# Patient Record
Sex: Female | Born: 1970
Health system: Southern US, Community
[De-identification: ages and names within clinical notes are randomized; demographics above are authoritative.]

## PROBLEM LIST (undated history)

## (undated) DIAGNOSIS — K7460 Unspecified cirrhosis of liver: Secondary | ICD-10-CM

## (undated) DIAGNOSIS — G43909 Migraine, unspecified, not intractable, without status migrainosus: Secondary | ICD-10-CM

## (undated) DIAGNOSIS — F419 Anxiety disorder, unspecified: Secondary | ICD-10-CM

## (undated) DIAGNOSIS — F909 Attention-deficit hyperactivity disorder, unspecified type: Secondary | ICD-10-CM

## (undated) DIAGNOSIS — G4730 Sleep apnea, unspecified: Secondary | ICD-10-CM

## (undated) DIAGNOSIS — I639 Cerebral infarction, unspecified: Secondary | ICD-10-CM

## (undated) DIAGNOSIS — M1990 Unspecified osteoarthritis, unspecified site: Secondary | ICD-10-CM

## (undated) DIAGNOSIS — E119 Type 2 diabetes mellitus without complications: Secondary | ICD-10-CM

## (undated) DIAGNOSIS — K760 Fatty (change of) liver, not elsewhere classified: Secondary | ICD-10-CM

## (undated) DIAGNOSIS — J189 Pneumonia, unspecified organism: Secondary | ICD-10-CM

## (undated) DIAGNOSIS — R569 Unspecified convulsions: Secondary | ICD-10-CM

## (undated) DIAGNOSIS — R079 Chest pain, unspecified: Secondary | ICD-10-CM

## (undated) DIAGNOSIS — R160 Hepatomegaly, not elsewhere classified: Secondary | ICD-10-CM

## (undated) DIAGNOSIS — F32A Depression, unspecified: Secondary | ICD-10-CM

## (undated) HISTORY — DX: Migraine, unspecified, not intractable, without status migrainosus: G43.909

## (undated) HISTORY — PX: ANTERIOR CERVICAL DECOMP/DISCECTOMY FUSION: SHX1161

## (undated) HISTORY — DX: Chest pain, unspecified: R07.9

## (undated) HISTORY — PX: LAPAROSCOPIC PELVIC LYMPH NODE BIOPSY: SHX5914

## (undated) HISTORY — DX: Unspecified convulsions: R56.9

## (undated) HISTORY — PX: WISDOM TOOTH EXTRACTION: SHX21

## (undated) HISTORY — DX: Anxiety disorder, unspecified: F41.9

## (undated) HISTORY — PX: VAGINAL HYSTERECTOMY: SUR661

---

## 1998-07-02 ENCOUNTER — Other Ambulatory Visit: Admission: RE | Admit: 1998-07-02 | Discharge: 1998-07-02 | Payer: Self-pay | Admitting: *Deleted

## 1998-10-16 ENCOUNTER — Other Ambulatory Visit: Admission: RE | Admit: 1998-10-16 | Discharge: 1998-10-16 | Payer: Self-pay | Admitting: Obstetrics and Gynecology

## 1999-03-09 ENCOUNTER — Inpatient Hospital Stay (HOSPITAL_COMMUNITY): Admission: AD | Admit: 1999-03-09 | Discharge: 1999-03-09 | Payer: Self-pay | Admitting: Obstetrics and Gynecology

## 1999-03-09 ENCOUNTER — Encounter: Admission: RE | Admit: 1999-03-09 | Discharge: 1999-06-07 | Payer: Self-pay | Admitting: Obstetrics and Gynecology

## 1999-05-26 ENCOUNTER — Inpatient Hospital Stay (HOSPITAL_COMMUNITY): Admission: AD | Admit: 1999-05-26 | Discharge: 1999-05-29 | Payer: Self-pay | Admitting: Obstetrics and Gynecology

## 2000-03-20 ENCOUNTER — Other Ambulatory Visit: Admission: RE | Admit: 2000-03-20 | Discharge: 2000-03-20 | Payer: Self-pay | Admitting: Obstetrics and Gynecology

## 2000-10-04 ENCOUNTER — Encounter (INDEPENDENT_AMBULATORY_CARE_PROVIDER_SITE_OTHER): Payer: Self-pay

## 2000-10-04 ENCOUNTER — Inpatient Hospital Stay (HOSPITAL_COMMUNITY): Admission: RE | Admit: 2000-10-04 | Discharge: 2000-10-07 | Payer: Self-pay | Admitting: Obstetrics and Gynecology

## 2002-04-02 ENCOUNTER — Encounter: Admission: RE | Admit: 2002-04-02 | Discharge: 2002-04-02 | Payer: Self-pay | Admitting: Obstetrics and Gynecology

## 2002-04-02 ENCOUNTER — Encounter: Payer: Self-pay | Admitting: Obstetrics and Gynecology

## 2002-05-27 ENCOUNTER — Encounter: Admission: RE | Admit: 2002-05-27 | Discharge: 2002-05-27 | Payer: Self-pay

## 2002-09-24 ENCOUNTER — Encounter: Admission: RE | Admit: 2002-09-24 | Discharge: 2002-09-24 | Payer: Self-pay | Admitting: Obstetrics and Gynecology

## 2002-09-24 ENCOUNTER — Encounter: Payer: Self-pay | Admitting: Obstetrics and Gynecology

## 2003-01-21 ENCOUNTER — Ambulatory Visit (HOSPITAL_COMMUNITY): Admission: RE | Admit: 2003-01-21 | Discharge: 2003-01-21 | Payer: Self-pay | Admitting: Neurology

## 2003-04-02 ENCOUNTER — Encounter: Admission: RE | Admit: 2003-04-02 | Discharge: 2003-04-02 | Payer: Self-pay | Admitting: Obstetrics and Gynecology

## 2003-04-02 ENCOUNTER — Encounter: Payer: Self-pay | Admitting: Obstetrics and Gynecology

## 2003-05-07 ENCOUNTER — Other Ambulatory Visit: Admission: RE | Admit: 2003-05-07 | Discharge: 2003-05-07 | Payer: Self-pay | Admitting: Obstetrics and Gynecology

## 2003-06-19 ENCOUNTER — Ambulatory Visit (HOSPITAL_COMMUNITY): Admission: RE | Admit: 2003-06-19 | Discharge: 2003-06-19 | Payer: Self-pay | Admitting: Obstetrics and Gynecology

## 2004-05-19 ENCOUNTER — Other Ambulatory Visit: Admission: RE | Admit: 2004-05-19 | Discharge: 2004-05-19 | Payer: Self-pay | Admitting: Obstetrics and Gynecology

## 2005-06-29 ENCOUNTER — Other Ambulatory Visit: Admission: RE | Admit: 2005-06-29 | Discharge: 2005-06-29 | Payer: Self-pay | Admitting: Obstetrics and Gynecology

## 2006-02-13 ENCOUNTER — Ambulatory Visit (HOSPITAL_COMMUNITY): Admission: RE | Admit: 2006-02-13 | Discharge: 2006-02-13 | Payer: Self-pay | Admitting: Obstetrics and Gynecology

## 2006-04-21 ENCOUNTER — Encounter (INDEPENDENT_AMBULATORY_CARE_PROVIDER_SITE_OTHER): Payer: Self-pay | Admitting: Specialist

## 2006-04-21 ENCOUNTER — Ambulatory Visit (HOSPITAL_COMMUNITY): Admission: RE | Admit: 2006-04-21 | Discharge: 2006-04-21 | Payer: Self-pay | Admitting: General Surgery

## 2009-10-25 ENCOUNTER — Emergency Department (HOSPITAL_COMMUNITY): Admission: EM | Admit: 2009-10-25 | Discharge: 2009-10-25 | Payer: Self-pay | Admitting: Emergency Medicine

## 2010-01-06 ENCOUNTER — Emergency Department (HOSPITAL_COMMUNITY): Admission: EM | Admit: 2010-01-06 | Discharge: 2010-01-06 | Payer: Self-pay | Admitting: Emergency Medicine

## 2010-03-05 ENCOUNTER — Encounter: Admission: RE | Admit: 2010-03-05 | Discharge: 2010-03-05 | Payer: Self-pay | Admitting: Gastroenterology

## 2010-06-22 ENCOUNTER — Ambulatory Visit (HOSPITAL_COMMUNITY): Admission: RE | Admit: 2010-06-22 | Discharge: 2010-06-22 | Payer: Self-pay | Admitting: Obstetrics and Gynecology

## 2010-11-07 LAB — COMPREHENSIVE METABOLIC PANEL
AST: 50 U/L — ABNORMAL HIGH (ref 0–37)
Albumin: 4.1 g/dL (ref 3.5–5.2)
Calcium: 9.1 mg/dL (ref 8.4–10.5)
Chloride: 106 mEq/L (ref 96–112)
GFR calc Af Amer: >60 mL/min (ref >60)
GFR calc non Af Amer: >60 mL/min (ref >60)
Glucose, Bld: 123 mg/dL — ABNORMAL HIGH (ref 70–99)
Total Bilirubin: 0.3 mg/dL (ref 0.3–1.2)
Total Protein: 7.1 g/dL (ref 6.0–8.3)

## 2010-11-07 LAB — DIFFERENTIAL
Basophils Absolute: 0 10*3/uL (ref 0.0–0.1)
Basophils Relative: 0 % (ref 0–1)
Eosinophils Absolute: 0.4 10*3/uL (ref 0.0–0.7)
Eosinophils Relative: 4 % (ref 0–5)
Lymphs Abs: 1.7 10*3/uL (ref 0.7–4.0)
Monocytes Absolute: 0.6 10*3/uL (ref 0.1–1.0)
Monocytes Relative: 7 % (ref 3–12)

## 2010-11-07 LAB — CBC
MCHC: 33.2 g/dL (ref 30.0–36.0)
Platelets: 257 10*3/uL (ref 150–400)
RDW: 12.3 % (ref 11.5–15.5)
WBC: 9.5 10*3/uL (ref 4.0–10.5)

## 2010-11-07 LAB — RAPID URINE DRUG SCREEN, HOSP PERFORMED
Amphetamines: NOT DETECTED
Barbiturates: NOT DETECTED
Benzodiazepines: NOT DETECTED
Cocaine: NOT DETECTED

## 2010-11-07 LAB — URINALYSIS, ROUTINE W REFLEX MICROSCOPIC
Hgb urine dipstick: NEGATIVE
pH: 5.5 (ref 5.0–8.0)

## 2010-12-31 NOTE — Op Note (Signed)
   NAME:  Mariah Gregory, Mariah Gregory                      ACCOUNT NO.:  [id number]   MEDICAL RECORD NO.:  [id number]                   PATIENT TYPE:  OUT   LOCATION:  [facility]                                  FACILITY:  [facility]   PHYSICIAN:  Marlan Palau, M.D.               DATE OF BIRTH:  1971-08-03   DATE OF PROCEDURE:  01/21/2003  DATE OF DISCHARGE:                                 OPERATIVE REPORT   HISTORY:  This is a 40 year old patient with a history of headaches and  paresthesias in the upper extremities.  The patient is being evaluated for  possible demyelinating disease.   A lumbar puncture was performed in the fetal position on the left side.  Approximately 2 mL of 1% Xylocaine was used as a local anesthetic.  A 20-  gauge spinal needle was inserted into the L3-4 interspace and approximately  18 mL of clear, colorless spinal fluid was removed for testing.  Opening  pressure was 140 mmH2O.  Tube #1 was sent for VDRL and cryptococcal antigen.  Tube #2 was sent for oligoclonal banding, IgG/albumin ratio.  Tube #3 was  sent for cells, differential, glucose, protein.  Tube #4 was sent for Lyme  panel.  Blood work was sent for ANA, rheumatoid factor, sedimentation rate,  antiphospholipid antibody panel, lupus anticoagulant, and homocysteine  level.  There were no complications of the above procedure.  The patient  tolerated the procedure well.                                               Marlan Palau, M.D.    CKW/MEDQ  D:  01/21/2003  T:  01/21/2003  Job:  [id number]

## 2010-12-31 NOTE — Op Note (Signed)
NAME:  Mariah Gregory, Mariah Gregory                      ACCOUNT NO.:  [id number]   MEDICAL RECORD NO.:  [id number]                   PATIENT TYPE:  AMB   LOCATION:  [facility]                                  FACILITY:  [facility]   PHYSICIAN:  Michelle L. Vincente Poli, M.D.            DATE OF BIRTH:  [birth year]   DATE OF PROCEDURE:  06/19/2003  DATE OF DISCHARGE:                                 OPERATIVE REPORT   PREOPERATIVE DIAGNOSIS:  Pelvic __________   PROCEDURE:  Diagnostic laparoscopy.   SURGEON:  Michelle L. Vincente Poli, M.D.   ANESTHESIA:  General.   FINDINGS:  __________.   DESCRIPTION OF PROCEDURE:  She was intubated without difficulty and was  placed in the lithotomy position.  The abdomen and genitalia were prepped  and draped in the usual sterile fashion and a catheter was placed into the  bladder.  A sponge stick was inserted into the vagina.  Attention was turned  to the abdomen, where a small infraumbilical incision was made with a  scalpel.  Then the Veress needle was inserted and pneumoperitoneum was  achieved without difficulty.  The 10/11 mm trocar was inserted without  difficulty.  We then inserted the laparoscope into the abdominal cavity and  immediately noted that there were some infraumbilical adhesions surrounding  the infraumbilical port.  There were no bleeders noted and no bowel injuries  noted.  We then visualized the pelvis.  The patient was placed in  Trendelenburg position and a 5 mm suprapubic port was then placed under  direct visualization.  Inspection of the pelvis reveals that the right ovary  was, in fact, retroperitoneal, adherent to the right sidewall.  There was no  cyst seen, and it grossly appeared very normal.  At cuff, however, there  were some thick and filmy adhesions of the bowel to the vaginal cuff, all  along the cuff, and especially on the right sidewall.  This is consistent  with where the patient's pain was as well as her complaint of dyspareunia.  We  then used sharp dissection and carefully took the adhesions down with  good hemostasis.  There was some serous fluid in the cul-de-sac and this was  aspirated, and I think this is why there was a possible ovarian cyst  detected on ultrasound in the office because of the serous fluid and the  pelvic adhesions noted.  At the end of the procedure no vaginal bleeding was  noted.  The patient was then placed in reverse Trendelenburg, and there was  no bleeding that was noted in the cul-de-sac.  We then removed the 5 mm  trocar without any difficulty and it was hemostatic, and then after  pneumoperitoneum was released, the 10/11 mm trocar was released.  The  incisions were closed with 3-0 Vicryl interrupted and local was infiltrated  at each area.  All sponge, lap, and instrument counts were correct x2.  The  patient went to  the recovery room in stable condition.                                               Michelle L. Vincente Poli, M.D.    Florestine Avers  D:  06/19/2003  T:  06/19/2003  Job:  [id number]

## 2010-12-31 NOTE — Op Note (Signed)
Mariah Gregory, Mariah Gregory            ACCOUNT NO.:  [id number]   MEDICAL RECORD NO.:  [id number]          PATIENT TYPE:  AMB   LOCATION:  DAY                          FACILITY:  [facility]   PHYSICIAN:  Ollen Gross. Vernell Morgans, M.D. DATE OF BIRTH:  11/08/1970   DATE OF PROCEDURE:  04/21/2006  DATE OF DISCHARGE:  04/21/2006                                 OPERATIVE REPORT   PREOPERATIVE DIAGNOSIS:  Pelvic pain.   POSTOPERATIVE DIAGNOSIS:  Pelvic pain and mass in the omentum, intra-  abdominal adhesions.   PROCEDURE:  Exploratory laparoscopy, lysis of adhesions and resection of  omental mass.   SURGEON:  Dr. Carolynne Edouard.   ASSISTANT:  Dr. Luisa Hart.   ANESTHESIA:  General endotracheal.   PROCEDURE:  After informed consent was obtained, the patient was brought to  the operating room, placed in supine position on the operating table.  After  adequate induction of general anesthesia, the patient's abdomen was prepped  with Betadine and draped in usual sterile manner.  The area above the  umbilicus was infiltrated with 0.25% Marcaine.  A small incision was made  with a 15 blade knife.  This incision was carried down through the  subcutaneous tissue bluntly with a hemostat and [employer] retractors until  the linea alba was identified.  The nail was incised with a 15 blade knife  and each side was grasped with Kocher clamps and elevated anteriorly.  The  preperitoneal space was probed bluntly with a hemostat until the peritoneum  was opened and access was gained to the abdominal cavity.  The 0 Vicryl  pursestring stitch was placed in the fascia around the opening.  Hasson  cannula was placed through the opening and anchored in place with the  previously placed Vicryl pursestring stitch.  The abdomen was then  insufflated with carbon dioxide without difficulty.  The patient was placed  in Trendelenburg position.  Next in the left lower quadrant a site was  chosen for placement of 5 mm port.  This area was  infiltrated 0.25%  Marcaine.  A small stab incision was made with 15 blade knife and 5 mm port  was placed bluntly through this incision into the abdominal cavity under  direct vision.  The same was done on the right lower quadrant for another 5  mm port.  The laparoscope was inserted through the Hasson cannula and the  pelvis was examined.  The patient had some adhesions of omentum to the  anterior abdominal wall where her previous surgery had been.  Her previous  CT scan had questioned a cystic mass in right pelvis and on further  examination it appeared as though the patient had a mass within the omentum  that was hanging in the right pelvis.  It could be lifted as part of the  omentum.  It was not attached to the pelvic sidewall, the adhesions to the  anterior abdominal wall were taken down sharply with the harmonic scalpel.  The omentum just proximal to the mass was then also divided with the  harmonic scalpel.  This freed the mass completely from any surrounding  structures.  Laparoscopic bag was then inserted through the Hasson cannula  using a 5 mm camera through the left 5 mm port and the mass was placed  within the bag and bag was sealed.  There were no other abnormalities noted  in the abdomen on further inspection.  The abdomen was irrigated copious  amounts of saline.  The pelvis was now otherwise free.  The patient did have  what looked like some scar tissue on the right pelvic sidewall where her  tube and ovary had been removed earlier in the year.  Pictures were taken of  this.  The bag with the mass was then removed through the supraumbilical  port with the Hasson cannula without difficulty.  The fascial defect was  then closed with the previously placed Vicryl pursestring stitch as well as  with a couple of other interrupted 0 Vicryl stitches.  The rest of ports  removed under direct vision.  The gas was allowed to escape and the skin  incisions were all then closed with  interrupted 4-0 Monocryl  subcuticular stitches.  Benzoin, Steri-Strips were applied.  The patient  tolerated well.  At the end of the case all needle, sponge and instrument  counts were correct.  The patient was then awakened and taken to recovery  room in stable condition.  The mass was then sent to pathology for further  evaluation.      Ollen Gross. Vernell Morgans, M.D.  Electronically Signed     PST/MEDQ  D:  04/24/2006  T:  04/24/2006  Job:  [id number]

## 2010-12-31 NOTE — Op Note (Signed)
[facility]  Patient:    Mariah Gregory, Mariah Gregory                   MRN: [id number] Proc. Date: 10/04/00 Adm. Date:  [id number] Attending:  Marcelle Overlie                           Operative Report  PREOPERATIVE DIAGNOSIS:       Pelvic pain.  POSTOPERATIVE DIAGNOSIS:      Pelvic pain.  PROCEDURE:                    Total abdominal hysterectomy and left salpingo-oophorectomy.  SURGEON:                      Marcelle Overlie, M.D.  ASSISTANT:                    Luvenia Redden, M.D.  ANESTHESIA:                   General.  ESTIMATED BLOOD LOSS:         200 cc.  DESCRIPTION OF PROCEDURE:     The patient was taken to the operating room. She was intubated without difficulty.  The abdomen was prepped and draped in the usual sterile fashion after a Foley catheter was placed in the bladder. Using a scalpel, a large transverse incision was made and carried down to the fascia using electrocautery with good hemostasis.  The fascia was scored in the midline and extended lateral using Mayo scissors.  The fascia was picked up and the rectus muscles were dissected away from the ______ inferiorly. The rectus muscles were divided in the midline and the peritoneum was entered bluntly.  The peritoneal incision was then stretched and extended superiorly and inferiorly with good attention to the bladder.  The OConnor-OSullivan self retaining retractor was placed in the abdominal cavity.  A bladder blade was inserted and the small and large bowel were packed into the upper abdomen. Attention was then turned to the pelvis, which revealed a normal appearing small uterus, a deep cul-de-sac, questionable minimal endometriosis on the left pelvic side wall and a normal right tube and ovary.  Given the patients pain, which was mostly centered in the left lower quadrant , and with her history of endometriosis, it was decided to perform a total abdominal hysterectomy and an LSO  with preservation of her right tube and ovary.  We then proceeded with hysterectomy in the following fashion.  We grasped the triple pedicle on both sides ______ using Kelly clamps.  The round ligament was then ligated using 0 Vicryl suture on the right side and then transected using electrocautery.  The broad ligament was then incised anteriorly and posteriorly using electrocautery.  A bladder flap was then created anteriorly using digital and electrocautery.  An avascular window was then created beneath the triple pedicle and a Heaney clamp was placed across this.  The pedicle was cut and suture ligated using 0 Vicryl suture and secured with a free tie of 0 Vicryl.  The bladder flap was created more on the right side and the uterine artery was then clamped using a curved Heaney clamp at the level of the internal os on the right side.  Attention was then turned to the left side where, in a likewise fashion, the round ligament was identified and suture ligated using 0  Vicryl suture.  It was then transected using electrocautery and the anterior and posterior leaves of the broad ligament was then incised.  The ureter was identified in its normal location.  The infundibulopelvic ligament was then clamped with attention being well above the ureter.  The pedicle was cut and suture ligated using 0 Vicryl suture.  We then skeletonized the uterine artery on the left side and the uterine artery was clamped using a curved Heaney clamp at the level of the internal os.  The pedicle was cut and suture ligated using 0 Vicryl suture.  Using  a series of straight Kocher clamps, the cardinal and uterosacral complexes were then clamped on either side of the cervix and the pedicles were cut and suture ligated using 0 Vicryl suture.  After we reached the level of the vagina, the vagina was then entered using a scalpel and then the specimen was removed using Mayo scissors.  The vaginal cuff was then closed.  The  angle stitches were then performed by transfixing the vaginal apex to the uterosacral cardinal pedicles using 0 Vicryl suture.  The remainder of the cuff was closed using 0 Vicryl suture.  Irrigation was performed.  All pedicles were inspected and noted to be normal and hemostatic.  All instruments and laparotomy pads were removed from the abdomen.  THe peritoneum was closed in a single layer using 0 Vicryl in a continuous running stitch and the rectus muscles were reapproximated using the same 0 Vicryl.  The fascia was closed using 0 Vicryl in a continuous running stitch x 2, starting at each corner and meeting in the midline.  After irrigation of the subcutaneous layers, the skin was closed with staples.  All sponge, lap and instrument counts were correct x 2.  The patient tolerated the procedure well and went to the recovery room in stable condition. DD:  10/04/00 TD:  10/05/00 Job: [id number]

## 2010-12-31 NOTE — Discharge Summary (Signed)
[facility]  Patient:    Mariah Gregory, Mariah Gregory                   MRN: [id number] Adm. Date:  [id number] Disc. Date: [id number] Attending:  Marcelle Overlie                           Discharge Summary  ADMISSION DIAGNOSES:          1. History of endometriosis.                               2. Pelvic pain.  DISCHARGE DIAGNOSES:          1. History of endometriosis.                               2. Pelvic pain.                               3. Status post total abdominal hysterectomy and                                  left salpingo-oophorectomy.  HOSPITAL COURSE:              Patient is a 40 year old gravida 2, para 2 with known history of endometriosis status post two laparoscopies in 1995 and 1999 with disease on the left pelvic side wall who presents complaining of recurrent left lower quadrant pain.  Patient has been treated with Depo-Lupron and oral contraceptives and now wants a TAH, LSO.  Patient went to the operating room and intraoperatively found a normal right tube and ovary, minimal endometriosis on the left side wall.  Patient did very well after surgery.  Her EBL at the time of surgery was 200 cc.  Her postoperative hematocrit was 29.2 and postoperative WBC was 12.  By postoperative day #3 she was doing well.  She was ambulating, tolerating a regular diet, tolerating oral pain medications, remained afebrile with stable vital signs.  She was discharged home on postoperative day #3.  Discharged home in good condition. She was advised no heavy lifting, no driving for two weeks, and to call if she has any nausea, vomiting, abdominal pain, temperature greater than 100.5, or redness or drainage from incision.  She is given prescription for ibuprofen and Tylox.  She will follow-up in the office in four weeks. DD:  11/13/00 TD:  11/13/00 Job: [id number]

## 2012-01-31 ENCOUNTER — Other Ambulatory Visit: Payer: Self-pay | Admitting: Obstetrics and Gynecology

## 2013-03-29 ENCOUNTER — Ambulatory Visit: Payer: Self-pay | Admitting: Diagnostic Neuroimaging

## 2013-04-16 ENCOUNTER — Other Ambulatory Visit: Payer: Self-pay

## 2013-04-16 ENCOUNTER — Encounter: Payer: Self-pay | Admitting: Diagnostic Neuroimaging

## 2013-04-16 ENCOUNTER — Ambulatory Visit (INDEPENDENT_AMBULATORY_CARE_PROVIDER_SITE_OTHER): Payer: BC Managed Care – PPO | Admitting: Diagnostic Neuroimaging

## 2013-04-16 VITALS — BP 114/76 | HR 95 | Temp 97.6°F | Ht 65.5 in | Wt 189.0 lb

## 2013-04-16 DIAGNOSIS — R569 Unspecified convulsions: Secondary | ICD-10-CM

## 2013-04-16 DIAGNOSIS — G4730 Sleep apnea, unspecified: Secondary | ICD-10-CM | POA: Insufficient documentation

## 2013-04-16 NOTE — Patient Instructions (Signed)
Continue lamotrigine and zonisamide.  No driving until seizure free x 6 months (last seizure 02/23/13).  Follow up with Darrol Angel and Dr. Vickey Huger.

## 2013-04-16 NOTE — Progress Notes (Signed)
[facility]  PATIENT: Mariah Gregory DOB: [birth year]  REFERRING CLINICIAN: Marchelle Folks [place] HISTORY FROM: patient  REASON FOR VISIT: follow up (established patient of Darrol Angel and Dr. Vickey Huger)   HISTORICAL  CHIEF COMPLAINT:  No chief complaint on file.   HISTORY OF PRESENT ILLNESS:   UPDATE 04/16/13: Patient returns for evaluation of seizures. Patient has been seen in our practice by Dr. Anne Hahn, Dr. Sharene Skeans, Dr. Vickey Huger, and NPs Elveria Rising and Darrol Angel in the past. First seizure of life patient reports was in 2005 when she was out of her sister, fall tremors all over, felt bad and had to sit down. No loss of consciousness, tongue biting or convulsions. Patient continues to have intermittent episodes of feeling funny over the years. At some point patient was started on Zonegran for management of headaches and possible seizures. Patient was then transition to [facility] for headache management.  02/23/2013 patient had an episode where she felt hot, began shaking and fell to the ground. She had grip in both hands strongly together. No tongue biting or incontinence. She did not go to the hospital at that time because she did not have insurance. Now patient has insurance again and presents for further evaluation. Patient is now also on lamotrigine 200 mg at bedtime. No further seizure events.  UPDATE 08/24/11 (CM): She had a second opinion at [facility] for headaches with Dr. Marshell Garfinkel, she is on Zonegran 400mg  at night for seizures and headaches. He wanted a PSG .Sleep consult 01-31-11 for this patient by Dr. Vickey Huger.Patient's husband has reported her to be a loud snorer. She gained abdominal weight , 25 pounds,  and she reports EDS with an Epworth of 12 points. PSG showed an overall AHI of only 3.2, and the patient has TMJ- is not a candidate for  a dental device. She has bruxism. Dr. Vickey Huger had wanted her to try nasal pillow. She is still  trying to get a good fitting mask with equipment. Saw Dr. Marshell Garfinkel on 07/19/11 with changes to headache regime. She is to be seen next week for another nerve block. She currently has sinus issues. Patient has abdominal discomfort, has unexplained  high liver labs-transaminases.   PRIOR HPI (CM and Dohmeier): 42 year old, right handed, caucasian female who has a history of headaches and migraines.  She has a history of a closed head injury in June 1998 when she fell from a balcony. She has had seizures in the past, and took Lamictal for three years. She said that she had headaches while on the drug and stopped it. She described her seizures as feeling a warning sensation, then curling into a "fetal position", shaking for a few minutes, then rocking back and forth. She said that she sometimes weight gain spinning sensation blurred vision feeling allergies anxiety seizure confusion headache sleepiness snoring. had a headache before these episodes occurred and always had a headache afterwards. She said that she would be confused for a period of time afterwards, usually about 15-30 minutes. She said she would have one to episodes per year. She was last seen by Dr. Vickey Huger for sleep consult 01/31/11. Last bad seizure 10/24/09,  she said that she had not felt well during the day, and then was surprised with a birthday party that night. She had two  mixed drinks and 2 "jello shots" in the course of 4-5 hours, along with snack foods. She said that she remembers feeling tired, going to her husband and asking to  go home, when she was told that she slumped to the floor and began jerking all extremities for several minutes. She was confused afterwards, then very tired and had a headache. She was seen in the ER and instructed to follow up here. She called the office and an EEG and MRI was scheduled. Both studies were normal.  She says that she has been diagnosed with a sleep disorder, has CPAP at home but finds that she wakens  during the night with the mask in her hand, rather than on her face. She says that her "usual" headaches have not changed since the headache. She has been more anxious, worrying about the seizure. She has not had an increase in depression.    REVIEW OF SYSTEMS: Full 14 system review of systems performed and notable only for confusion headache sleepiness during seizure anxiety allergy feeling hot blurred vision weight gain spinning.  ALLERGIES: Allergies  Allergen Reactions  . Verapamil     HOME MEDICATIONS: Prior to Admission medications   Medication Sig Start Date End Date Taking? Authorizing Provider  amitriptyline (ELAVIL) 50 MG tablet Take 50 mg by mouth at bedtime. 04/13/13  Yes Historical Provider, MD  baclofen (LIORESAL) 10 MG tablet Take 10 mg by mouth 3 (three) times daily. Take 1-2 tablets 3 times daily.   Yes Historical Provider, MD  Diclofenac Potassium 50 MG PACK Take by mouth.   Yes Historical Provider, MD  eletriptan (RELPAX) 20 MG tablet Take 40 mg by mouth as needed for migraine. One tablet by mouth at onset of headache. May repeat in 2 hours if headache persists or recurs.   Yes Historical Provider, MD  escitalopram (LEXAPRO) 20 MG tablet Take 20 mg by mouth every morning. 03/14/13  Yes Historical Provider, MD  Estradiol (ELESTRIN) 0.52 MG/0.87 GM (0.06%) GEL Apply topically.   Yes Historical Provider, MD  Gabapentin, PHN, (GRALISE) 600 MG TABS Take 600 mg by mouth 3 (three) times daily with meals.   Yes Historical Provider, MD  lamoTRIgine (LAMICTAL) 100 MG tablet Take 200 mg by mouth at bedtime.  04/06/13  Yes Historical Provider, MD  metFORMIN (GLUCOPHAGE-XR) 500 MG 24 hr tablet Take 500 mg by mouth once. 03/14/13  Yes Historical Provider, MD  zonisamide (ZONEGRAN) 100 MG capsule Take 400 mg by mouth at bedtime.  02/09/13  Yes Historical Provider, MD   No outpatient prescriptions prior to visit.   No facility-administered medications prior to visit.    PAST MEDICAL  HISTORY: Past Medical History  Diagnosis Date  . Anxiety   . Migraine     PAST SURGICAL HISTORY: Past Surgical History  Procedure Laterality Date  . Vaginal hysterectomy    . Laparoscopic pelvic lymph node biopsy      FAMILY HISTORY: Family History  Problem Relation Age of Onset  . Hypertension Mother   . Migraines Mother   . Diabetes Mother   . Heart disease Mother     SOCIAL HISTORY:  History   Social History  . Marital Status: Married    Spouse Name: N/A    Number of Children: 2  . Years of Education: 12   Occupational History  . self employed    Social History Main Topics  . Smoking status: Never Smoker   . Smokeless tobacco: Not on file  . Alcohol Use: Yes  . Drug Use: No  . Sexual Activity: Yes    Partners: Male   Other Topics Concern  . Not on file   Social History  Narrative  . No narrative on file     PHYSICAL EXAM  Filed Vitals:   04/16/13 0926  BP: 114/76  Pulse: 95  Temp: 97.6 F (36.4 C)  Height: 5' 5.5" (1.664 m)  Weight: 189 lb (85.73 kg)    Not recorded    Body mass index is 30.96 kg/(m^2).  GENERAL EXAM: Patient is in no distress  CARDIOVASCULAR: Regular rate and rhythm, no murmurs, no carotid bruits  NEUROLOGIC: MENTAL STATUS: awake, alert, language fluent, comprehension intact, naming intact CRANIAL NERVE: no papilledema on fundoscopic exam, pupils equal and reactive to light, visual fields full to confrontation, extraocular muscles intact, no nystagmus, facial sensation and strength symmetric, uvula midline, shoulder shrug symmetric, tongue midline. MOTOR: normal bulk and tone, full strength in the BUE, BLE SENSORY: normal and symmetric to light touch, pinprick, temperature, vibration COORDINATION: finger-nose-finger, fine finger movements normal REFLEXES: deep tendon reflexes present and symmetric GAIT/STATION: narrow based gait; able to walk on toes, heels and tandem; romberg is negative   DIAGNOSTIC DATA (LABS,  IMAGING, TESTING) - I reviewed patient records, labs, notes, testing and imaging myself where available.  Lab Results  Component Value Date   WBC 9.5 10/25/2009   HGB 13.1 10/25/2009   HCT 39.7 10/25/2009   MCV 93.3 10/25/2009   PLT 257 10/25/2009      Component Value Date/Time   NA 138 10/25/2009 0320   K 3.6 10/25/2009 0320   CL 106 10/25/2009 0320   CO2 23 10/25/2009 0320   GLUCOSE 123* 10/25/2009 0320   BUN 10 10/25/2009 0320   CREATININE 0.72 10/25/2009 0320   CALCIUM 9.1 10/25/2009 0320   PROT 7.1 10/25/2009 0320   ALBUMIN 4.1 10/25/2009 0320   AST 50* 10/25/2009 0320   ALT 96* 10/25/2009 0320   ALKPHOS 63 10/25/2009 0320   BILITOT 0.3 10/25/2009 0320   GFRNONAA >60 10/25/2009 0320   GFRAA  Value: >60        The eGFR has been calculated using the MDRD equation. This calculation has not been validated in all clinical situations. eGFR's persistently <60 mL/min signify possible Chronic Kidney Disease. 10/25/2009 0320   No results found for this basename: CHOL, HDL, LDLCALC, LDLDIRECT, TRIG, CHOLHDL   No results found for this basename: HGBA1C   No results found for this basename: VITAMINB12   No results found for this basename: TSH      ASSESSMENT AND PLAN  42 y.o. year old female here with headaches and seizures. Previously on zonisamide. Now lamotrigine has been added on. Now stable.  PLAN: 1. Continue LTG 200mg  qhs + ZNG 400mg  qhs 2. No driving until seizure free x 6 months 3. Continue follow up with Darrol Angel and Dr. Vickey Huger  Return in about 3 months (around 07/16/2013) for with Dr. Vickey Huger or Darrol Angel.   Suanne Marker, MD 04/16/2013, 10:40 AM Certified in Neurology, Neurophysiology and Neuroimaging  [facility] [address] [place], [place] [address] [phone]

## 2013-04-19 ENCOUNTER — Encounter: Payer: Self-pay | Admitting: Diagnostic Neuroimaging

## 2013-04-24 NOTE — Progress Notes (Unsigned)
This encounter was created in error - please disregard.  This encounter was created in error - please disregard.

## 2013-05-07 ENCOUNTER — Telehealth: Payer: Self-pay | Admitting: Diagnostic Neuroimaging

## 2013-05-08 ENCOUNTER — Telehealth: Payer: Self-pay | Admitting: Diagnostic Neuroimaging

## 2013-05-08 NOTE — Telephone Encounter (Signed)
I called patient and let her know I will forward her request to Dr. Marjory Lies. We will contact her when the appropriate referral has been made.

## 2013-05-09 ENCOUNTER — Telehealth: Payer: Self-pay | Admitting: *Deleted

## 2013-05-09 NOTE — Telephone Encounter (Signed)
Please call pertaining my seizure on Monday. Dr. Marjory Lies said to call if she had one before her next visit. I am not feeling good, cant walk straight and i am dizzy. My headaches persist. A nurse has already called and stated that they were going to refer me somewhere, but I don't know where.I am confused please call and advise.

## 2013-05-10 NOTE — Telephone Encounter (Signed)
Patient had a sz on  05/06/13 and she would like to know will she be referred to an academic center for further testing.

## 2013-05-13 NOTE — Telephone Encounter (Signed)
I called and spoke to pt.  She had seizure on Monday last.  She has been taking her medications.   She mentions a referral for I think VEEG.  I do not see anything in the note when seen last 04-16-13 with Dr. Marjory Lies.  I made appt with CM/NP tomorrow 1100, will discuss then.

## 2013-05-14 ENCOUNTER — Encounter: Payer: Self-pay | Admitting: Nurse Practitioner

## 2013-05-14 ENCOUNTER — Ambulatory Visit (INDEPENDENT_AMBULATORY_CARE_PROVIDER_SITE_OTHER): Payer: BC Managed Care – PPO | Admitting: Nurse Practitioner

## 2013-05-14 ENCOUNTER — Other Ambulatory Visit: Payer: Self-pay | Admitting: Nurse Practitioner

## 2013-05-14 VITALS — BP 123/82 | HR 85 | Ht 66.0 in | Wt 194.0 lb

## 2013-05-14 DIAGNOSIS — G4730 Sleep apnea, unspecified: Secondary | ICD-10-CM

## 2013-05-14 DIAGNOSIS — R51 Headache: Secondary | ICD-10-CM

## 2013-05-14 DIAGNOSIS — Z79899 Other long term (current) drug therapy: Secondary | ICD-10-CM

## 2013-05-14 DIAGNOSIS — R569 Unspecified convulsions: Secondary | ICD-10-CM

## 2013-05-14 DIAGNOSIS — R519 Headache, unspecified: Secondary | ICD-10-CM | POA: Insufficient documentation

## 2013-05-14 NOTE — Progress Notes (Signed)
I have read the note, and I agree with the clinical assessment and plan.  Mariah Gregory   

## 2013-05-14 NOTE — Progress Notes (Signed)
[facility]  PATIENT: Mariah Gregory DOB: 1971/08/11   REASON FOR VISIT: for seizures   HISTORY OF PRESENT ILLNESS: Returns for followup after last visit with Dr. Marjory Lies on 04/24/2013. She had an evaluation for seizures. Patient has been seen in our practice by Dr. Anne Hahn, Dr. Sharene Skeans, Dr. Vickey Huger, and NPs Elveria Rising and Darrol Angel in the past. First seizure of life patient reports was in 2005 when she was out of her sister, fall tremors all over, felt bad and had to sit down. No loss of consciousness, tongue biting or convulsions. Patient continues to have intermittent episodes of feeling funny over the years. At some point patient was started on Zonegran for management of headaches and possible seizures. Patient was then transitioned  to [facility] for headache management.  02/23/2013 patient had an episode where she felt hot, began shaking and fell to the ground. She had grip in both hands strongly together. No tongue biting or incontinence. She did not go to the hospital at that time because she did not have insurance. Now patient has insurance again and presents for further evaluation. Patient is now also on lamotrigine 200 mg at bedtime. She is also on gabapentin 600 (3) daily. She is also taking Zonegran 400mg  daily. On return visit today patient claims she has had 2 additional seizures the most recent occurring on Monday the 29th while she and her [place] were at the beach. She denies any alcohol intake while there. She denies any changes in her medications i.e. color etc. for generics. Once again  she felt hot began shaking dizzy nauseated but did not vomit, cannot communicate with her surroundings but was aware. She had a bad headache afterwards. She did not go to the emergency room  [place] the beach. She has failed Topamax and Keppra in the past.   HISTORY: Mariah Gregory is a 42 year old, right handed, caucasian female who has a history of  headaches and migraines.  She has a history of a closed head injury in June 1998 when she fell from a balcony. She has had seizures in the past, and took Lamictal for three years. She said that she had headaches while on the drug and stopped it. She described her seizures as feeling a warning sensation, then curling into a "fetal position", shaking for a few minutes, then rocking back and forth. She said that she sometimes had a headache before these episodes occurred and always had a headache afterwards. She said that she would be confused for a period of time afterwards, usually about 15-30 minutes. She said she would have one to episodes per year. She was last seen by Dr. Vickey Huger for sleep consult6/18/12. Last bad seizure 10/24/09,  she said that she had not felt well during the day, and then was surprised with a birthday party that night. She had two  mixed drinks and 2 "jello shots" in the course of 4-5 hours, along with snack foods. She said that she remembers feeling tired, going to her husband and asking to go home, when she was told that she slumped to the floor and began jerking all extremities for several minutes. She was confused afterwards, then very tired and had a headache. She was seen in the ER and instructed to follow up here. She called the office and an EEG and MRI was scheduled. Both studies were normal.  She says that she has been diagnosed with a sleep disorder, has CPAP at home but finds that  she wakens during the night with the mask in her hand, rather than on her face. She says that her "usual" headaches have not changed since the headache. She has been more anxious, worrying about the seizure. She has not had an increase in depression.  She had a second opinion at [facility] for headaches with Dr. Marshell Garfinkel, she is on Zonegran 400mg  at night for seizures and headaches. He wanted a PSG .Sleep consult 01-31-11 for this patient by Dr. Vickey Huger.Patient's husband has reported her to be a loud  snorer. She gained abdominal weight , 25 pounds,  and she reports EDS with an Epworth of 12 points. PSG showed an overall AHI of only 3.2, and the patient has TMJ- is not a candidate for  a dental device. She has bruxism. Dr. Vickey Huger had wanted her to try nasal pillow. She is still trying to get a good fitting mask with equipment. Saw Dr. Marshell Garfinkel on 07/19/11 with changes to headache regime. She is to be seen next week for another nerve block. She currently has sinus issues. Patient has abdominal discomfort, has unexplained  high liver labs-transaminases.      REVIEW OF SYSTEMS: Full 14 system review of systems performed and notable only for:  Constitutional: Weight gain  Cardiovascular: N/A  Ear/Nose/Throat: N/A  Skin: N/A  Eyes: Blurred vision  Respiratory: N/A  Gastroitestinal: N/A  Hematology/Lymphatic: N/A  Endocrine: Feeling hot Musculoskeletal:N/A  Allergy/Immunology: N/A  Neurological: Headache, seizure  Psychiatric: Decreased energy, insomnia  ALLERGIES: Allergies  Allergen Reactions  . Verapamil     HOME MEDICATIONS: Outpatient Prescriptions Prior to Visit  Medication Sig Dispense Refill  . amitriptyline (ELAVIL) 50 MG tablet Take 50 mg by mouth at bedtime.      . baclofen (LIORESAL) 10 MG tablet Take 10 mg by mouth 3 (three) times daily. Take 1-2 tablets 3 times daily.      . Diclofenac Potassium 50 MG PACK Take by mouth.      . eletriptan (RELPAX) 20 MG tablet Take 40 mg by mouth as needed for migraine. One tablet by mouth at onset of headache. May repeat in 2 hours if headache persists or recurs.      Marland Kitchen escitalopram (LEXAPRO) 20 MG tablet Take 20 mg by mouth every morning.      . Estradiol (ELESTRIN) 0.52 MG/0.87 GM (0.06%) GEL Apply topically.      . Gabapentin, PHN, (GRALISE) 600 MG TABS Take 600 mg by mouth 3 (three) times daily with meals.      . lamoTRIgine (LAMICTAL) 100 MG tablet Take 200 mg by mouth at bedtime.       . metFORMIN (GLUCOPHAGE-XR) 500 MG 24 hr  tablet Take 500 mg by mouth once.      . zonisamide (ZONEGRAN) 100 MG capsule Take 400 mg by mouth at bedtime.        No facility-administered medications prior to visit.    PAST MEDICAL HISTORY: Past Medical History  Diagnosis Date  . Anxiety   . Migraine   Seizure disorder PAST SURGICAL HISTORY: Past Surgical History  Procedure Laterality Date  . Vaginal hysterectomy    . Laparoscopic pelvic lymph node biopsy      FAMILY HISTORY: Family History  Problem Relation Age of Onset  . Hypertension Mother   . Migraines Mother   . Diabetes Mother   . Heart disease Mother     SOCIAL HISTORY: History   Social History  . Marital Status: Married    Spouse Name:  N/A    Number of Children: 2  . Years of Education: 12   Occupational History  . self employed    Social History Main Topics  . Smoking status: Never Smoker   . Smokeless tobacco: Never Used  . Alcohol Use: Yes  . Drug Use: No  . Sexual Activity: Yes    Partners: Male   Other Topics Concern  . Not on file   Social History Narrative  . No narrative on file     PHYSICAL EXAM  Filed Vitals:   05/14/13 1051  BP: 123/82  Pulse: 85  Height: 5\' 6"  (1.676 m)  Weight: 194 lb (87.998 kg)   Body mass index is 31.33 kg/(m^2).  Generalized: Well developed, in no acute distress     Neurological examination   Mentation: Alert oriented to time, place, history taking. Follows all commands speech and language fluent  Cranial nerve II-XII: Fundoscopic exam reveals sharp disc margins.Pupils were equal round reactive to light extraocular movements were full, visual field were full on confrontational test. Facial sensation and strength were normal. hearing was intact to finger rubbing bilaterally. Uvula tongue midline. head turning and shoulder shrug and were normal and symmetric.Tongue protrusion into cheek strength was normal. Motor: normal bulk and tone, full strength in the BUE, BLE, fine finger movements normal,  no pronator drift. No focal weakness Sensory: normal and symmetric to light touch, pinprick, and  vibration  Coordination: finger-nose-finger, heel-to-shin bilaterally, no dysmetria Reflexes: Brachioradialis 2/2, biceps 2/2, triceps 2/2, patellar 2/2, Achilles 2/2, plantar responses were flexor bilaterally. Gait and Station: Rising up from seated position without assistance, normal stance, without trunk ataxia, moderate stride, good arm swing, smooth turning, able to perform tiptoe, and heel walking without difficulty. Tandem is stable   DIAGNOSTIC DATA (LABS, IMAGING, TESTING) -None to review   ASSESSMENT AND PLAN  42 y.o. year old female  has a past medical history of Anxiety and Migraine. and seizure. The patient has had 3 seizures in the last 2 months, 2 in the last 2 weeks. She denies that her medications has changed i.e. color, for her generics. She denies missing doses of meds.   Discussed with Dr. Anne Hahn,  [place] MD.  Will get lamotrigine level and adjust med as needed along with CBC and CMP which have not been done in several years She will followup in 4 weeks  Nilda Riggs, [facility], [facility], APRN  [facility] [address] [place], [place] [address] [phone]

## 2013-05-14 NOTE — Telephone Encounter (Signed)
[facility] called and wanted to know the next step for the patient in regards to her seizures.  I left a message for them that the patient had an appointment today for follow up in regards to seizures.

## 2013-05-14 NOTE — Patient Instructions (Addendum)
Pt will get Lamictal level today Continue Zonegran Lamictal and Gralise at current doses F/U in 4 weeks

## 2013-05-15 ENCOUNTER — Telehealth: Payer: Self-pay | Admitting: Nurse Practitioner

## 2013-05-15 MED ORDER — LAMOTRIGINE 100 MG PO TABS: 200.0000 mg | ORAL_TABLET | Freq: Every day | ORAL | Status: DC

## 2013-05-15 NOTE — Telephone Encounter (Signed)
Last OV says:  Discussed with Dr. Anne Hahn, [place] MD.  Will get lamotrigine level and adjust med as needed along with CBC and CMP which have not been done in several years  She will followup in 4 weeks   Sent one refill since med dose may need to be changed, and pt was asked to F/U in 4 weeks.

## 2013-05-16 ENCOUNTER — Telehealth: Payer: Self-pay | Admitting: Nurse Practitioner

## 2013-05-16 NOTE — Telephone Encounter (Signed)
I called the patient back.  Advised at this time she should remain on the same dose.  We have to wait for her labs to determine if it will be changed or not.  Patient verbalized understanding.

## 2013-05-16 NOTE — Telephone Encounter (Signed)
I am waiting for labs to see if she needs adjustment and may switch to ER. I do not see that she has had labs done

## 2013-05-16 NOTE — Telephone Encounter (Signed)
I reviewed OV Note.  It says: Pt will get Lamictal level today Continue Zonegran Lamictal and Gralise at current doses F/U in 4 weeks  I called the patient.  She says she was told we would be switching her to Lamictal XR.  I do not see this noted in the chart.  I think we are waiting for lab results.  Eber Jones, are we changing her to XR form?  Please advise.

## 2013-05-17 ENCOUNTER — Telehealth: Payer: Self-pay | Admitting: Nurse Practitioner

## 2013-05-17 MED ORDER — LAMOTRIGINE ER 300 MG PO TB24: 300.0000 mg | ORAL_TABLET | Freq: Every day | ORAL | Status: DC

## 2013-05-17 NOTE — Telephone Encounter (Signed)
I have not received her labs that were ordered on her visit. Did she get them drawn somewhere else.

## 2013-05-17 NOTE — Telephone Encounter (Signed)
I called pt and she had done at [facility] on [address] same day.  I will ask Arlene.

## 2013-05-17 NOTE — Telephone Encounter (Signed)
Pt called and made aware of Lamictal level. Will increase Lamictal to 250mg  XR daily for 1 week then 300mg . PA will be required. Patient aware to pick up samples.

## 2013-05-17 NOTE — Telephone Encounter (Signed)
Lab results placed in [facility] in box.

## 2013-05-20 ENCOUNTER — Telehealth: Payer: Self-pay | Admitting: Nurse Practitioner

## 2013-05-20 LAB — COMPREHENSIVE METABOLIC PANEL
ALT: 168 IU/L — ABNORMAL HIGH (ref 0–32)
AST: 103 IU/L — ABNORMAL HIGH (ref 0–40)
Albumin: 4.3 g/dL (ref 3.5–5.5)
BUN/Creatinine Ratio: 11 (ref 9–23)
BUN: 9 mg/dL (ref 6–24)
Chloride: 104 mmol/L (ref 97–108)
Globulin, Total: 2.5 g/dL (ref 1.5–4.5)
Potassium: 4.6 mmol/L (ref 3.5–5.2)
Sodium: 143 mmol/L (ref 134–144)
Total Bilirubin: 0.2 mg/dL (ref 0.0–1.2)

## 2013-05-20 LAB — CBC WITH DIFFERENTIAL
Basophils Absolute: 0 10*3/uL (ref 0.0–0.2)
Basos: 0 %
Eosinophils Absolute: 0.4 10*3/uL (ref 0.0–0.4)
Hemoglobin: 13.8 g/dL (ref 11.1–15.9)
MCHC: 34.8 g/dL (ref 31.5–35.7)
Monocytes Absolute: 0.4 10*3/uL (ref 0.1–0.9)
Neutrophils Absolute: 7 10*3/uL (ref 1.4–7.0)
WBC: 9.8 10*3/uL (ref 3.4–10.8)

## 2013-05-21 NOTE — Telephone Encounter (Signed)
The patient was recently increased from 200 mg daily of Lamictal to 250 mg come up nicely to go to 300 mg at night. The patient has begun having dizziness, and true vertigo when lying down. The patient is taking Dramamine without much benefit. She will try meclizine at the 25 mg 3 times daily. She is not to go to the 300 mg dosing of the Lamictal until the dizziness is improved. This could potentially be the cause of her symptoms.

## 2013-05-25 ENCOUNTER — Telehealth: Payer: Self-pay | Admitting: Neurology

## 2013-05-25 NOTE — Telephone Encounter (Signed)
The patient called, she had another seizure today. No medication adjustments were made. Will likely need video EEG monitoring. The patient is on 3 seizure meds at this time

## 2013-05-27 ENCOUNTER — Telehealth: Payer: Self-pay | Admitting: Neurology

## 2013-05-27 DIAGNOSIS — R569 Unspecified convulsions: Secondary | ICD-10-CM

## 2013-05-27 NOTE — Telephone Encounter (Signed)
I called patient. I will get a video EEG monitoring referral set up for [facility].

## 2013-05-30 ENCOUNTER — Telehealth: Payer: Self-pay | Admitting: Neurology

## 2013-05-30 NOTE — Telephone Encounter (Signed)
Spoke with patient and informed per [facility]  that she cannot contact [facility] to sched.until 10/20(contact person is on vacation), will call patient to set up a sched. appt on that date, patient understood and said that she will cancel her botox appt until she goes to [facility].

## 2013-06-03 ENCOUNTER — Telehealth: Payer: Self-pay | Admitting: Neurology

## 2013-06-04 NOTE — Telephone Encounter (Signed)
Spoke to patient and told her referral has been made to [facility] for video EEG, and she will hear from them for the appointment.  She expressed understanding.

## 2013-06-10 ENCOUNTER — Telehealth: Payer: Self-pay | Admitting: Neurology

## 2013-06-10 NOTE — Telephone Encounter (Signed)
Spoke to patient and told her I have called [facility] about the video EEG, and left them a message to call me back when the patient can get scheduled.  She is also asking that the provider write a letter to have her husband dismissed from jury duty.  I told her that it was up to the provider because normally it is written for the patient and not the spouse.  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  She will send or bring in the jury summons.

## 2013-06-10 NOTE — Telephone Encounter (Signed)
Calling asking to speak to Mariah Gregory. States she can't get [facility] to call  Her back. She left messages Thursday, and Friday of last week and they have not called her back for her EEG. She also is waiting on the letter to be faxed to the court. She wants a call back. Slightly upset

## 2013-06-11 NOTE — Telephone Encounter (Signed)
I really do not understand this message, jury duty is typically one day. He is not our patient.

## 2013-06-12 NOTE — Telephone Encounter (Signed)
Spoke to patient and she is scheduled for the 24hour video EEG monitor, because of patient's insurance.  I also explained that we only due jury dismissal letters for our patients and not their family members.

## 2013-06-14 ENCOUNTER — Ambulatory Visit: Payer: BC Managed Care – PPO | Admitting: Nurse Practitioner

## 2013-07-02 ENCOUNTER — Telehealth: Payer: Self-pay | Admitting: Nurse Practitioner

## 2013-07-03 NOTE — Telephone Encounter (Signed)
I called patient. I have accessed the medical records from [facility], and I can see that the procedure was done on 06/24/2013. No report yet has been generated. I will call the patient as soon as I get the results.

## 2013-07-03 NOTE — Telephone Encounter (Signed)
Called patient and patient stated that she has had the EEG monitoring done and needs the results. Please advise.

## 2013-07-04 ENCOUNTER — Telehealth: Payer: Self-pay | Admitting: Neurology

## 2013-07-04 NOTE — Telephone Encounter (Signed)
The patient is being followed through [facility] for her headaches, may be considered for Botox injections. The patient has been taken off of the Lexapro and sumatriptan and Relpax. They wanted to know what medication was to be given when she has a seizure. I have indicated that she is not to take anything. The patient does not have convulsive seizures. The patient is on Zonegran, gabapentin, and Lamictal. The patient in general has been fairly controlled with the seizures, if the seizures in the future are not control, the patient may be a candidate for video EEG monitoring.

## 2013-07-08 ENCOUNTER — Telehealth: Payer: Self-pay | Admitting: Nurse Practitioner

## 2013-07-08 NOTE — Telephone Encounter (Signed)
I called the patient again, we still do not have any results for the video EEG monitoring study.

## 2013-07-15 ENCOUNTER — Telehealth: Payer: Self-pay | Admitting: Nurse Practitioner

## 2013-07-15 MED ORDER — LAMOTRIGINE 25 MG PO TABS: ORAL_TABLET | ORAL | Status: DC

## 2013-07-15 NOTE — Telephone Encounter (Signed)
I called and spoke with patient. I let her know I have contacted [facility] and left VM for Geni Bers [phone]) requesting the video EEG results.   She asked that I let Silvestre Gunner, NP and Dr. Anne Hahn know that she had one seizure in Friday night, two Saturday morning and one last night.   Please advise.

## 2013-07-15 NOTE — Telephone Encounter (Signed)
I called the patient. I still do not have the results from the video EEG monitoring study. The patient still having ongoing seizures, I will go up on the Lamictal taking 50 mg in the morning for 2 weeks, then go to 100 mg in the morning.

## 2013-07-16 ENCOUNTER — Telehealth: Payer: Self-pay | Admitting: Neurology

## 2013-07-16 DIAGNOSIS — G40219 Localization-related (focal) (partial) symptomatic epilepsy and epileptic syndromes with complex partial seizures, intractable, without status epilepticus: Secondary | ICD-10-CM

## 2013-07-16 NOTE — Telephone Encounter (Signed)
I called the patient. The patient March 10 events during mandatory EEG evaluation. The patient indicates that these events were associated with headache, not her seizure episodes. I'll get a referral to [facility] for inpatient evaluation. No seizure events were recorded during the study. EEG was normal.

## 2013-07-23 ENCOUNTER — Ambulatory Visit: Payer: BC Managed Care – PPO | Admitting: Nurse Practitioner

## 2013-07-25 ENCOUNTER — Telehealth: Payer: Self-pay | Admitting: *Deleted

## 2013-07-25 NOTE — Telephone Encounter (Signed)
Called patient and informed that referral had been sent to [facility] on 07/18/13 by Danella Maiers, she verbalized understanding

## 2013-07-26 ENCOUNTER — Encounter: Payer: Self-pay | Admitting: Neurology

## 2013-07-26 ENCOUNTER — Ambulatory Visit (INDEPENDENT_AMBULATORY_CARE_PROVIDER_SITE_OTHER): Payer: BC Managed Care – PPO | Admitting: Neurology

## 2013-07-26 VITALS — BP 110/80 | HR 105 | Resp 15 | Ht 66.5 in | Wt 193.0 lb

## 2013-07-26 DIAGNOSIS — R519 Headache, unspecified: Secondary | ICD-10-CM

## 2013-07-26 DIAGNOSIS — R569 Unspecified convulsions: Secondary | ICD-10-CM

## 2013-07-26 DIAGNOSIS — R51 Headache: Secondary | ICD-10-CM

## 2013-07-26 DIAGNOSIS — R4189 Other symptoms and signs involving cognitive functions and awareness: Secondary | ICD-10-CM

## 2013-07-26 DIAGNOSIS — G4730 Sleep apnea, unspecified: Secondary | ICD-10-CM

## 2013-07-26 DIAGNOSIS — IMO0001 Reserved for inherently not codable concepts without codable children: Secondary | ICD-10-CM

## 2013-07-26 NOTE — Progress Notes (Signed)
[facility]  Provider:  Melvyn Novas, M D  Referring Provider: Ailene Ravel, MD Primary Care Physician:  Ailene Ravel, MD  Chief Complaint  Patient presents with  . NP OSA/ Sleep Apnea    HPI:  Mariah Gregory is a 42 y.o. female  Is seen here as a referral/ revisit  from Dr. Nathanial Rancher for sleep apnea.   The patient is an established patient of Dr. Lesia Sago, MD. She was referred to me for sleep apnea evaluation and to [facility] for seizure work up.  She was last see by Darrol Angel. MD.   On  04-24-13 she was last seen by Dr. Marjory Lies-  A seizure disorder was  First reported in 2005, witnessed by her sister in law. No tongue biting or incontinence was ever noted, but reportedly began shaking and fell to the ground. She is  Confused and unable to answer while the spell occurs and for 15 minutes after wards. She never had a spell when not in company, all were witnessed. She seems frequently to resume a fetal position after the spell, rocking herself to comfort.   Intermittently over the years she had some episodes of feeling funny  ( possible aura ?) and at some point she was started on Zonegran  for the management of headaches and possible seizures.  The patient then transitioned to the [facility] for headache management.  Please note that the patient has a history of a closed head injury June 1998 when she fell from a balcony. She had been on Lamictal for 3 years after that.  She was referred for a sleep consult and underwent a sleep study with Dr Asencion Islam. . The husband had reported the patient to be a loud snorer she is also reportedly gained some weight which made it more likely that she may suffer from sleep apnea by mouth and she endorsed the Epworth sleepiness score at 12 points at that time. Meanwhile she is endorsing the Epworth score on the at 7 point but still have a high fatigue severity at 40 compliance. The patient was at the time not  deemed fit to use a dental device for her father mild apnea because of a history of TMJ and bruxism. Dr. Asencion Islam evaluated her for sleep apnea and diagnosed obstructive sleep apnea and placed her on BiPAP in the year 2007 a repeat PSG he is short in all 4 AHI of only 3.2.  The patient had been onan auto set machine at that time giving her a broad window -between 5 cm water pressure and 20 cm.  The 95 st percentile in Summer 2012 was 11.7 cm water pressure ,  with the residual AHIL 5.4 but poorest compliance, less than one hour of  daily user time.  The patient presents today visit request for a new CPAP titration study.  Was reminded to bring her machine, but didn't . Since she hasn't used it in a while it may not have been giving [place] a much more information.  Patient has no family history of seizures, sleep disorders, works as Publishing rights manager, has no recent seizures ( ever an abnormal EEG ?), and chronic headaches with loss of memory ( subjective) , could this be CADASIL ? Mitochondrial disorder ?    Medical history  1)basically untreated OSA - mild by polysomnography and untreated mild obstructive sleep apnea since 2008,  2) posttraumatic had injury disorder fell at age 61,   #3 possible seizure disorder resulting from  head injury?     Review of Systems: Out of a complete 14 system review, the patient complains of only the following symptoms, and all other reviewed systems are negative.  The patient has left TMJ since she gets B[facility] for cervicalgia and headaches. .  Dr. Marshell Garfinkel , who used to be at [facility], he is now at the [facility] in [place] . She endorsed further weight gain, fatigue, snoring constipation, a spinning sensation-vertical. Blurred vision diplopia,depression, disinterest,  sleepiness is improved,   Memory loss, feeling hot and passing out.    History   Social History  . Marital Status: Married    Spouse  Name: N/A    Number of Children: 2  . Years of Education: 12   Occupational History  . self employed    Social History Main Topics  . Smoking status: Never Smoker   . Smokeless tobacco: Never Used  . Alcohol Use: .5 - 1.5 oz/week    1-3 drink(s) per week     Comment: social  . Drug Use: No  . Sexual Activity: Yes    Partners: Male   Other Topics Concern  . Not on file   Social History Narrative  . No narrative on file    Family History  Problem Relation Age of Onset  . Hypertension Mother   . Migraines Mother   . Diabetes Mother   . Heart disease Mother     Past Medical History  Diagnosis Date  . Anxiety   . Migraine   . Seizures     Past Surgical History  Procedure Laterality Date  . Vaginal hysterectomy    . Laparoscopic pelvic lymph node biopsy      Current Outpatient Prescriptions  Medication Sig Dispense Refill  . amitriptyline (ELAVIL) 50 MG tablet Take 50 mg by mouth at bedtime.      . Diclofenac Potassium 50 MG PACK Take by mouth.      . Estradiol (ELESTRIN) 0.52 MG/0.87 GM (0.06%) GEL Apply topically.      . Gabapentin, PHN, (GRALISE) 600 MG TABS Take 600 mg by mouth 3 (three) times daily with meals.      . LamoTRIgine (LAMICTAL XR) 300 MG TB24 Take 1 tablet (300 mg total) by mouth daily.  30 tablet  3  . lamoTRIgine (LAMICTAL) 25 MG tablet 2 tablets in the morning for 2 weeks, then take 4 tablets in the morning  120 tablet  3  . metFORMIN (GLUCOPHAGE-XR) 500 MG 24 hr tablet Take 500 mg by mouth once.      . zonisamide (ZONEGRAN) 100 MG capsule Take 400 mg by mouth at bedtime.       . baclofen (LIORESAL) 10 MG tablet Take 10 mg by mouth 3 (three) times daily. Take 1-2 tablets 3 times daily.      Marland Kitchen tiZANidine (ZANAFLEX) 4 MG tablet        No current facility-administered medications for this visit.    Allergies as of 07/26/2013 - Review Complete 07/26/2013  Allergen Reaction Noted  . Verapamil  04/16/2013    Vitals: BP 110/80  Pulse 105   Resp 15  Ht 5' 6.5" (1.689 m)  Wt 193 lb (87.544 kg)  BMI 30.69 kg/m2 Last Weight:  Wt Readings from Last 1 Encounters:  07/26/13 193 lb (87.544 kg)   Last Height:   Ht Readings from Last 1 Encounters:  07/26/13 5' 6.5" (1.689 m)    Physical  exam:  General: The patient is awake, alert and appears not in acute distress. The patient is well groomed. Head: Normocephalic, atraumatic. Neck is supple. Mallampati 3, neck circumference: 15, no retrognathia.  Cardiovascular:  Regular rate and rhythm, without  murmurs or carotid bruit, and without distended neck veins. Respiratory: Lungs are clear to auscultation. Skin:  Without evidence of edema, or rash Trunk: BMI is  Elevated.  patient  has normal posture.  Neurologic exam : The patient is awake and alert, oriented to place and time.  Memory subjective described as intact. There is a normal attention span & concentration ability. Speech is fluent without  dysarthria, dysphonia or aphasia. Mood and affect are appropriate.  Cranial nerves: Pupils are equal and briskly reactive to light. Funduscopic exam without  evidence of pallor or edema. Extraocular movements  in vertical and horizontal planes intact and without nystagmus. Visual fields by finger perimetry are intact. Hearing to finger rub intact.  Facial sensation intact to fine touch. Facial motor strength is symmetric and tongue and uvula move midline.  Motor exam:   Normal tone and normal muscle bulk and symmetric normal strength in all extremities.  Sensory:  Fine touch, pinprick and vibration were tested in all extremities. Proprioception is tested in the upper extremities only. This was  normal.  Coordination: Rapid alternating movements in the fingers/hands is tested and normal. Finger-to-nose maneuver tested and normal without evidence of ataxia, dysmetria or tremor.  Gait and station: Patient walks without assistive device . Strength within normal limits. Stance is wider based  ,  stable .  Tandem gait is fragmented. Romberg testing is positive, the patient has truncal ataxia while seated. .  Deep tendon reflexes: in the upper and lower extremities are symmetric and intact.    Assessment:  After physical and neurologic examination, review of laboratory studies, imaging, neurophysiology testing and pre-existing records, assessment is ;  OSA is likely a co- factor to the fatigue , but her EDS Epworth 7 is not elevated. Will check to make sure OSA is even still present, and if mild OSA is found ,  will order referral to a dentist for possible dental mandibular advancement.      Plan:  Treatment plan and additional workup : PSG with SPLIT at score of 3% and AHi of 10 , seizure montage.

## 2013-07-26 NOTE — Patient Instructions (Addendum)
Night Terror  A night terror is a sleep disorder characterized by extreme fright and a temporary inability to fully wake up. Although this happens mostly in children 3 to 43 years of age, with the peak at 4 to 6 years, any age can be affected. The terrors usually begin about 90 minutes after falling asleep.  Night terrors are different than nightmares. Nightmares are scary dreams. Most children have them occasionally. Some children have them more than once a week. Nightmares usually happen late in the sleep period towards morning. Night terrors are frightening episodes that disrupt family life. They usually only last a couple minutes. These short episodes seem extremely long because they are terrifying to those around them. When the episode is finished, your child will normally settle back to sleep without really waking up. Unlike nightmares, most children do not usually remember a night terror episode the next morning. Your child may come to you for comfort. Usually they can tell you about a dream, and why it was scary. CAUSES   A stressful physical or emotional event.  Fever.  Lack of sleep.  Medications that affect the brain. Children eventually outgrow these terrors as they reach adolescence. They are usually not caused by mental or physical illness.  SYMPTOMS   Your child will appear to suddenly wake from their sleep with gasping, moaning or screaming.  It is often impossible to fully wake the child. Although they may seem awake, your child will remain dazed or confused.  Your child may thrash around in bed and be unresponsive to you.  Your child will not seem to be aware of your presence and usually will not talk.  The terror may also cause a rapid heart rate and breathing along with sweating. DIAGNOSIS  Usually, a complete history and a physical exam are enough to diagnose night terrors. Your caregiver may order other tests if other problems are suspected. TREATMENT  Treating night  terror episodes requires gentleness.   Remove anything in the sleeping area that could hurt your child.  Avoid loud voices or movements that might frighten your child further. Remember, your is not aware they are having a night terror.  Your child may become more agitated if told that "it was just a dream." They feel it is very real. Calm your child by telling him/her, "I am here" or "I love you."  It is best if one parent stays with the child until the episode passes and the child is calm again and ready to go back to sleep. Medical Treatment  Adequate treatment does not exist for night terrors. In severe cases, tricyclic anti-depressants may be used as a temporary treatment. They are usually only prescribed when waking behavior is being affected.  Educate your family about the disorder. Reassure them that the episodes are not harmful.  HOME CARE INSTRUCTIONS   Prevent your child from being injured during an episode.  Be sure your home and child's room is safe (use toddler gates on staircases).  Do not use bunk beds for children who often have nightmares or night terrors.  Talk to your caregiver if your child ever gets hurt while sleeping. They may want to study your child during sleep.  Eliminate all sources of sleep disturbance.  Keep bedtimes and wake-up times routine. If your child has several night terrors, you can try to interrupt their sleep in order to prevent the night terror.   Keep track how many minutes the night terror begins from when your   child falls asleep.  Then, awaken your child 15 minutes before the expected night terror. Then keep them awake and out of bed for 5 minutes.  Continue this trial for a week. SEEK MEDICAL CARE IF:   The problems continue and medications or other measures are not working. Document Released: 06/24/2005 Document Revised: 10/24/2011 Document Reviewed: 11/07/2008 [facility] Patient Information 2014 [place], [place]. Polysomnography  (Sleep Studies) Polysomnography (PSG) is a series of tests used for detecting (diagnosing) obstructive sleep apnea and other sleep disorders. The tests measure how some parts of your body are working while you are sleeping. The tests are extensive and expensive. They are done in a sleep lab or hospital, and vary from center to center. Your caregiver may perform other more simple sleep studies and questionnaires before doing more complete and involved testing. Testing may not be covered by insurance. Some of these tests are:  An EEG (Electroencephalogram). This tests your brain waves and stages of sleep.  An EOG (Electrooculogram). This measures the movements of your eyes. It detects periods of REM (rapid eye movement) sleep, which is your dream sleep.  An EKG (Electrocardiogram). This measures your heart rhythm.  EMG (Electromyography). This is a measurement of how the muscles are working in your upper airway and your legs while sleeping.  An oximetry measurement. It measures how much oxygen (air) you are getting while sleeping.  Breathing efforts may be measured. The same test can be interpreted (understood) differently by different caregivers and centers that study sleep.  Studies may be given an apnea/hypopnea index (AHI). This is a number which is found by counting the times of no breathing or under breathing during the night, and relating those numbers to the amount of time spent in bed. When the AHI is greater than 15, the patient is likely to complain of daytime sleepiness. When the AHI is greater than 30, the patient is at increased risk for heart problems and must be followed more closely. Following the AHI also allows you to know how treatment is working. Simple oximetry (tracking the amount of oxygen that is taken in) can be used for screening patients who:  Do not have symptoms (problems) of OSA.  Have a normal Epworth Sleepiness Scale Score.  Have a low pre-test probability of  having OSA.  Have none of the upper airway problems likely to cause apnea.  Oximetry is also used to determine if treatment is effective in patients who showed significant desaturations (not getting enough oxygen) on their home sleep study. One extra measure of safety is to perform additional studies for the person who only snores. This is because no one can predict with absolute certainty who will have OSA. Those who show significant desaturations (not getting enough oxygen) are recommended to have a more detailed sleep study. Document Released: 02/05/2003 Document Revised: 10/24/2011 Document Reviewed: 08/01/2005 [facility] Patient Information 2014 [place], [place].

## 2013-07-30 ENCOUNTER — Ambulatory Visit (INDEPENDENT_AMBULATORY_CARE_PROVIDER_SITE_OTHER): Payer: BC Managed Care – PPO

## 2013-07-30 DIAGNOSIS — G4733 Obstructive sleep apnea (adult) (pediatric): Secondary | ICD-10-CM

## 2013-07-30 DIAGNOSIS — R519 Headache, unspecified: Secondary | ICD-10-CM

## 2013-07-30 DIAGNOSIS — G4730 Sleep apnea, unspecified: Secondary | ICD-10-CM

## 2013-07-30 DIAGNOSIS — IMO0001 Reserved for inherently not codable concepts without codable children: Secondary | ICD-10-CM

## 2013-07-30 NOTE — Sleep Study (Signed)
Pt arrived with spouse, complaining of left sided headache that feels like when she is going to have a seizure. Husband stayed through out the hook up at my request. I inquired a couple of times if they felt she should go to the hospitol and or needed to take any meds for her pain. They both said no to hospitol and pstient stated she had already taken all her meds .Marland Kitchen The hookup went without incident and pt states he headache and condition is no change is the same and wished to proceed with the study. i had inquired if spouse could stay for entire study in case of emergency, but he has two teenage sons, 21 and 18 years of age that he didn't want to leave unattended overnight. At my request he did leave his cell phone number.

## 2013-08-19 ENCOUNTER — Encounter: Payer: Self-pay | Admitting: *Deleted

## 2013-08-29 ENCOUNTER — Telehealth: Payer: Self-pay | Admitting: Neurology

## 2013-08-29 DIAGNOSIS — G4733 Obstructive sleep apnea (adult) (pediatric): Secondary | ICD-10-CM

## 2013-08-29 NOTE — Telephone Encounter (Signed)
Called to speak to Mariah AmenAmanda Del Rio, NP of [facility], unfortunately due to high call volume, I was only able to leave a message.  I left my personal cell phone and told her I would try to reach her again.  Followed up with this letter to her which was faxed today at 1:30 PM.  08/29/13  -  12:55 PM  Dear Mariah AmenAmanda Del Rio, NP:  I left a message at your office today but I wanted to be sure you received a response from me today regarding your patient Mariah Gregory and the concerns you contacted our office with.  I leave early on Thursdays so it is my intention that this may get to you by this afternoon.  First of all, please feel free to contact me on my personal cell phone to discuss if needed at [phone],  I will have it on all day.  Second, you are absolutely right in that you personally, as the referring provider, should have received a faxed copy of the results.  That is 100% our policy.  Our records showed Mariah BlanksMaura Hamrick, MD as the referring provider and this is why we faxed the results to her attention on 08/19/2013 - this was inaccurate - I am very sorry for this oversight.  Unfortunately, I also was not aware that you had called twice requesting these results to your attention.  Our direct number to the sleep lab is [phone] and I know that if this number is used for any sleep related patients, we will be sure to get back to you within 24 hours, usually sooner.  As far as your concerns of Mariah Gregory overall AHI of 7.6/hr as being clinically significant, I do agree with you, especially given her history of headaches and seizures.  It is important to note that her supine AHI was 15.7/hr vs. a non-supine AHI of 2.2/hr and her REM AHI was 11.4/hr vs. a non-REM AHI of 5.2.   I venture that Dr. Vickey Hugerohmeier felt it may not be significant enough to warrant CPAP treatment because the AHI was positionally dependent and REM dependent.  She may have also been concerned with Mariah Gregory's  past CPAP intolerance and poor clinical response to therapy.  Dr. Oliva Bustardohmeier's recommendation #3 states that positional therapy is advised, in other words, the patient should avoid sleeping on her back.  I am sure Dr. Vickey Hugerohmeier would discuss all recommendations and results with the patient in follow up and get Mariah Gregory's input before making a final decision on treatment for sleep apnea.  Please be assured, that if you choose to allow Mariah Gregory the opportunity to take care of any of your patients, we will not make any additional mistakes such as those that delayed these results from getting to you in a timely way.  We pride ourselves in the care and personal touch we can offer your patients and we appreciate your referrals.  If Mariah Gregory is interested in pursuing CPAP therapy, I am happy to work with her myself to offer a thorough mask fitting and desensitization session free of charge that has proven to have a significant impact on our patients who were previously unable to benefit from CPAP due to non-compliance.  Please accept my sincerest apologies for our mistakes.  I appreciate your communication to Mariah Gregory so we can be sure to make changes to prevent such mistakes from happening in the future.  Please don't hesitate to contact me as needed.  You have my number and are  welcome to use it anytime.  You may also reach me easily via e-mail if preferred at [email or web address]  Sincerely,   [name], RPSGT Sleep Lab Manager [facility]

## 2013-08-29 NOTE — Telephone Encounter (Signed)
Received a call from Amil AmenAmanda Del Rio, NP from the [facility].  She called to get a copy of the patients sleep study report but also to complain that she had requested the sleep study twice and had still not received it.  She asked was it not our policy to forward the results to the ordering provider.  I advised her that it was our policy and the results were forwarded to a Dr. Nathanial RancherHamrick.  She said, "that is not who ordered the sleep study".  She also requested the AHI from the sleep study.  I told her it was 7 - she feels that this is clinically significant and should be treated.  She said because of our service she doesn't think that she will refer any other patients to this office.  I urged her to please speak with the sleep lab manager regarding her concerns.  She had to take a conference call with a patient but did say she would be happy to discuss this further with someone.  I am faxing her the sleep study.  Her # is [phone]

## 2013-09-05 ENCOUNTER — Ambulatory Visit (INDEPENDENT_AMBULATORY_CARE_PROVIDER_SITE_OTHER): Payer: BC Managed Care – PPO

## 2013-09-05 ENCOUNTER — Telehealth: Payer: Self-pay | Admitting: *Deleted

## 2013-09-05 DIAGNOSIS — G4733 Obstructive sleep apnea (adult) (pediatric): Secondary | ICD-10-CM

## 2013-09-05 NOTE — Telephone Encounter (Signed)
[facility] returned The [employer] GambleStephanie's call.  She stated that the ambulatory EEG was completed on 06/24/13 and that was required by Mariah Gregory's insurance before they would authorize admission into the epilepsy monitoring program.  She will be starting the program on 09/16/13.  Andrey CampanileSandy stated that the ambulatory EEG was normal and she will be faxing over the results today.  She stated she had faxed them before, but is faxing them again to make sure that we have them.  Thanks

## 2013-09-05 NOTE — Telephone Encounter (Signed)
Received the following e-mail from provider: Amil AmenAmanda Del Rio, NP in regards to her patient Mariah HammedJennifer Gregory.  She would like her to have a CPAP Titration.  From: Theophilus KindsAmanda Del Rio [[email or web address]]  Sent: Thursday, September 05, 2013 12:04 PM To: [place] Mile[place]ugly, Joneisha Miles Subject: Mariah Hammedmbrose Mahaley [birth year]  Vincy Feliz,  Thank you for the paper letter and the phone call on behalf of Ms. Leo GrosserAmbrose.   She would like to proceed with the CPAP titration at your office instead of the site we had selected as an alternative following the previously discussed errors.  Thank you for addressing them with such detail.   Please let me know the next step in the scheduling process.  Her number is [phone].  Have a great day. Quay BurowAmanda     Amanda [facility] Nurse Practitioner  [facility] [address] [place], [place] [address] Phone: [phone] Fax: [phone] --------------------------------------------------------------------------------------------------------------------------------------------------------------------------------------------------------------- Thank you Marchelle FolksAmanda,  We will contact Mariah Gregory to schedule a titration ASAP.  Thank you so much for offering us another opportunity to care for her.  I will have our Sleep Lab Coordinator send you notification of her appointment once scheduled and we will be sure you get a copy of the report when completed.  Thank you once again.  Bonita QuinShawnee Elmus Mathes, RPSGT  Sleep Lab Manager [facility] @ [facility] [address], [address] [place], [place] [address]  [phone]  Fax [phone] [email or web address]

## 2013-09-05 NOTE — Telephone Encounter (Signed)
Called [place] at [facility]  [phone] to see if the patient had her ambulatory  EEG scheduled for 07-18-13. No answer left a message for her to call the office to let me know or fax the report if the patient have been seen. Waiting on a call back.

## 2013-09-05 NOTE — Addendum Note (Signed)
Addended by: Bonita QuinHEUGLY, Sumayyah Custodio B on: 09/05/2013 02:01 PM   Modules accepted: Orders

## 2013-09-05 NOTE — Telephone Encounter (Signed)
Pt has been scheduled for a CPAP titration study for 09/05/2013.  An appt notification will be faxed to Amil AmenAmanda Del Rio, NP

## 2013-09-05 NOTE — Telephone Encounter (Signed)
Called patient and patient stated that she just wanted Eber Jonesarolyn, NP to know, because she is keeping records of her seizure. I advised the patient that if she has any other problems, questions or concerns to call the office. Patient verbalized understanding. FYI

## 2013-09-11 ENCOUNTER — Telehealth: Payer: Self-pay | Admitting: Neurology

## 2013-09-11 NOTE — Telephone Encounter (Signed)
I called and spoke with the patient about her recent sleep study results. I informed the patient that Dr. Vickey Hugerohmeier has recommend new setting for her CPAP and I will fax the order to [facility] who will contact her once receiving benefits from [facility]. I will fax a copy of the report to Amil AmenAmanda Del Rio, NP and mail a copy of the report to the patient.

## 2013-09-27 ENCOUNTER — Other Ambulatory Visit: Payer: Self-pay | Admitting: *Deleted

## 2013-09-27 ENCOUNTER — Encounter: Payer: Self-pay | Admitting: *Deleted

## 2013-09-27 DIAGNOSIS — G4733 Obstructive sleep apnea (adult) (pediatric): Secondary | ICD-10-CM

## 2013-10-04 ENCOUNTER — Ambulatory Visit: Payer: Self-pay | Admitting: Nurse Practitioner

## 2013-10-31 NOTE — Progress Notes (Signed)
done

## 2013-12-02 ENCOUNTER — Encounter: Payer: Self-pay | Admitting: Neurology

## 2013-12-10 ENCOUNTER — Ambulatory Visit: Payer: Self-pay | Admitting: Nurse Practitioner

## 2013-12-12 ENCOUNTER — Encounter: Payer: Self-pay | Admitting: Neurology

## 2013-12-16 ENCOUNTER — Institutional Professional Consult (permissible substitution): Payer: BC Managed Care – PPO | Admitting: Neurology

## 2013-12-19 ENCOUNTER — Institutional Professional Consult (permissible substitution): Payer: Self-pay | Admitting: Neurology

## 2013-12-27 ENCOUNTER — Institutional Professional Consult (permissible substitution): Payer: BC Managed Care – PPO | Admitting: Neurology

## 2013-12-30 ENCOUNTER — Encounter: Payer: Self-pay | Admitting: Neurology

## 2014-02-05 ENCOUNTER — Encounter: Payer: Self-pay | Admitting: Neurology

## 2014-02-13 ENCOUNTER — Encounter: Payer: Self-pay | Admitting: Neurology

## 2014-03-19 ENCOUNTER — Telehealth: Payer: Self-pay | Admitting: Neurology

## 2014-03-19 NOTE — Telephone Encounter (Signed)
Patient calling to request an appointment with Dr. Vickey Hugerohmeier, i informed patient that per our records, she has been dismissed from our practice. Patient states she would like a call back to discuss. Please return call to patient and advise.

## 2014-03-20 NOTE — Telephone Encounter (Signed)
Message given to Dewitt HoesJanet Bowen, Office Administator.

## 2014-07-02 ENCOUNTER — Other Ambulatory Visit: Payer: Self-pay | Admitting: Obstetrics and Gynecology

## 2014-07-04 LAB — CYTOLOGY - PAP

## 2014-07-18 ENCOUNTER — Telehealth (HOSPITAL_COMMUNITY): Payer: Self-pay | Admitting: Unknown Physician Specialty

## 2014-07-18 ENCOUNTER — Ambulatory Visit (INDEPENDENT_AMBULATORY_CARE_PROVIDER_SITE_OTHER): Payer: BC Managed Care – PPO | Admitting: Cardiovascular Disease

## 2014-07-18 ENCOUNTER — Encounter: Payer: Self-pay | Admitting: Cardiovascular Disease

## 2014-07-18 VITALS — BP 124/90 | HR 76 | Ht 66.5 in | Wt 183.4 lb

## 2014-07-18 DIAGNOSIS — R079 Chest pain, unspecified: Secondary | ICD-10-CM

## 2014-07-18 NOTE — Progress Notes (Signed)
07/18/2014 Mariah Gregory   [birth year]  [id number]  Primary Physician Mariah Gregory,Mariah L, MD Primary Cardiologist: Mariah GessJonathan J. 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 She was referred by Dr. Adalberto Gregory for evaluation of atypical chest pain. Her primary care physician is [facility]. Nathanial [facility]. She has no critical factors. She does have a history of nonepileptic seizures dating back to head trauma decades ago. There is a question of a remote TIA as well. She developed atypical chest pain one month ago that occurs on a daily basis. It is located over the left breast, occurs for seconds at a time without radiation, nausea vomiting or diaphoresis. It is not related to activity. She was tried on one week of proton pump inhibition without changes in her symptoms.   Current Outpatient Prescriptions  Medication Sig Dispense Refill  . baclofen (LIORESAL) 10 MG tablet Take 10 mg by mouth 3 (three) times daily.     Marland Kitchen. escitalopram (LEXAPRO) 20 MG tablet Take 20 mg by mouth daily.    . Estradiol (ELESTRIN) 0.52 MG/0.87 GM (0.06%) GEL Apply topically.    . OnabotulinumtoxinA (BOTOX IJ) Inject as directed. For migraines    . tiZANidine (ZANAFLEX) 4 MG tablet Take 4 mg by mouth 3 (three) times daily.      No current facility-administered medications for this visit.    Allergies  Allergen Reactions  . Verapamil     History   Social History  . Marital Status: Married    Spouse Name: N/A    Number of Children: 2  . Years of Education: 12   Occupational History  . self employed    Social History Main Topics  . Smoking status: Never Smoker   . Smokeless tobacco: Never Used  . Alcohol Use: 0.5 - 1.5 oz/week    1-3 drink(s) per week     Comment: social  . Drug Use: No  . Sexual Activity:    Partners: Male   Other Topics Concern  . Not on file    Social History Narrative     Review of Systems: General: negative for chills, fever, night sweats or weight changes.  Cardiovascular: negative for chest pain, dyspnea on exertion, edema, orthopnea, palpitations, paroxysmal nocturnal dyspnea or shortness of breath Dermatological: negative for rash Respiratory: negative for cough or wheezing Urologic: negative for hematuria Abdominal: negative for nausea, vomiting, diarrhea, bright red blood per rectum, melena, or hematemesis Neurologic: negative for visual changes, syncope, or dizziness All other systems reviewed and are otherwise negative except as noted above.    Blood pressure 124/90, pulse 76, height 5' 6.5" (1.689 m), weight 183 lb 6.4 oz (83.19 kg).  General appearance: alert and no distress Neck: no adenopathy, no carotid bruit, no JVD, supple, symmetrical, trachea midline and thyroid not enlarged, symmetric, no tenderness/mass/nodules Lungs: clear to auscultation bilaterally Heart: regular rate and rhythm, S1, S2 normal, no murmur, click, rub or gallop Extremities: extremities normal, atraumatic, no cyanosis or edema and 1+ pedal pulses bilaterally  EKG normal sinus rhythm at 76 without ST or T-wave changes. I personally reviewed this EKG  ASSESSMENT AND PLAN:   Chest pain Mariah Gregory is a 43 year old mildly overweight married Caucasian female with no critical risk factors who does housecleaning as for a living. She developed atypical chest pain a month ago. The pain lasts seconds at a time, is located  over her left breast. Does not radiate nor is it associated with other symptoms. It occurs both at rest and with exertion. I think the prior probability of this being ischemic in nature is low. I am going to get a routine GXT to risk stratify her and we'll see her back when necessary      Mariah GessJonathan J. Samit Sylve MD [facility]ACP,FACC,FAHA, [facility] 07/18/2014 9:21 AM

## 2014-07-18 NOTE — Assessment & Plan Note (Signed)
Mariah Gregory is a 43 year old mildly overweight married Caucasian female with no critical risk factors who does housecleaning as for a living. She developed atypical chest pain a month ago. The pain lasts seconds at a time, is located over her left breast. Does not radiate nor is it associated with other symptoms. It occurs both at rest and with exertion. I think the prior probability of this being ischemic in nature is low. I am going to get a routine GXT to risk stratify her and we'll see her back when necessary

## 2014-07-18 NOTE — Patient Instructions (Signed)
Your physician has requested that you have an exercise tolerance test. For further information please visit [email or web address]. Please also follow instruction sheet, as given.  Dr Allyson SabalBerry has recommended you follow-up with him as needed.

## 2014-07-30 ENCOUNTER — Ambulatory Visit (HOSPITAL_COMMUNITY)
Admission: RE | Admit: 2014-07-30 | Discharge: 2014-07-30 | Disposition: A | Discharge status: Home / Self Care | Payer: BC Managed Care – PPO | Source: Ambulatory Visit | Attending: Cardiovascular Disease | Admitting: Cardiovascular Disease

## 2014-07-30 DIAGNOSIS — R079 Chest pain, unspecified: Secondary | ICD-10-CM

## 2015-12-16 DIAGNOSIS — Z01419 Encounter for gynecological examination (general) (routine) without abnormal findings: Secondary | ICD-10-CM | POA: Diagnosis not present

## 2015-12-16 DIAGNOSIS — Z1231 Encounter for screening mammogram for malignant neoplasm of breast: Secondary | ICD-10-CM | POA: Diagnosis not present

## 2015-12-16 DIAGNOSIS — Z6830 Body mass index (BMI) 30.0-30.9, adult: Secondary | ICD-10-CM | POA: Diagnosis not present

## 2015-12-22 DIAGNOSIS — G43719 Chronic migraine without aura, intractable, without status migrainosus: Secondary | ICD-10-CM | POA: Diagnosis not present

## 2015-12-28 DIAGNOSIS — G43809 Other migraine, not intractable, without status migrainosus: Secondary | ICD-10-CM | POA: Diagnosis not present

## 2015-12-28 DIAGNOSIS — G43011 Migraine without aura, intractable, with status migrainosus: Secondary | ICD-10-CM | POA: Diagnosis not present

## 2015-12-28 DIAGNOSIS — G43719 Chronic migraine without aura, intractable, without status migrainosus: Secondary | ICD-10-CM | POA: Diagnosis not present

## 2016-02-17 DIAGNOSIS — M5412 Radiculopathy, cervical region: Secondary | ICD-10-CM | POA: Diagnosis not present

## 2016-03-01 DIAGNOSIS — G43719 Chronic migraine without aura, intractable, without status migrainosus: Secondary | ICD-10-CM | POA: Diagnosis not present

## 2016-03-07 DIAGNOSIS — G43011 Migraine without aura, intractable, with status migrainosus: Secondary | ICD-10-CM | POA: Diagnosis not present

## 2016-03-07 DIAGNOSIS — G43719 Chronic migraine without aura, intractable, without status migrainosus: Secondary | ICD-10-CM | POA: Diagnosis not present

## 2016-03-10 ENCOUNTER — Other Ambulatory Visit: Payer: Self-pay | Admitting: Interventional Cardiology

## 2016-05-11 DIAGNOSIS — G43719 Chronic migraine without aura, intractable, without status migrainosus: Secondary | ICD-10-CM | POA: Diagnosis not present

## 2016-05-22 DIAGNOSIS — H81399 Other peripheral vertigo, unspecified ear: Secondary | ICD-10-CM | POA: Diagnosis not present

## 2016-05-25 DIAGNOSIS — G43809 Other migraine, not intractable, without status migrainosus: Secondary | ICD-10-CM | POA: Diagnosis not present

## 2016-05-25 DIAGNOSIS — G43719 Chronic migraine without aura, intractable, without status migrainosus: Secondary | ICD-10-CM | POA: Diagnosis not present

## 2016-07-22 DIAGNOSIS — G43719 Chronic migraine without aura, intractable, without status migrainosus: Secondary | ICD-10-CM | POA: Diagnosis not present

## 2016-08-03 DIAGNOSIS — G43809 Other migraine, not intractable, without status migrainosus: Secondary | ICD-10-CM | POA: Diagnosis not present

## 2016-08-03 DIAGNOSIS — G43011 Migraine without aura, intractable, with status migrainosus: Secondary | ICD-10-CM | POA: Diagnosis not present

## 2016-08-03 DIAGNOSIS — G4733 Obstructive sleep apnea (adult) (pediatric): Secondary | ICD-10-CM | POA: Diagnosis not present

## 2016-08-03 DIAGNOSIS — G43719 Chronic migraine without aura, intractable, without status migrainosus: Secondary | ICD-10-CM | POA: Diagnosis not present

## 2016-08-17 DIAGNOSIS — M5412 Radiculopathy, cervical region: Secondary | ICD-10-CM | POA: Diagnosis not present

## 2016-10-19 DIAGNOSIS — G43719 Chronic migraine without aura, intractable, without status migrainosus: Secondary | ICD-10-CM | POA: Diagnosis not present

## 2016-10-26 DIAGNOSIS — G4733 Obstructive sleep apnea (adult) (pediatric): Secondary | ICD-10-CM | POA: Diagnosis not present

## 2016-10-26 DIAGNOSIS — G43719 Chronic migraine without aura, intractable, without status migrainosus: Secondary | ICD-10-CM | POA: Diagnosis not present

## 2016-10-26 DIAGNOSIS — G43011 Migraine without aura, intractable, with status migrainosus: Secondary | ICD-10-CM | POA: Diagnosis not present

## 2016-10-26 DIAGNOSIS — G4763 Sleep related bruxism: Secondary | ICD-10-CM | POA: Diagnosis not present

## 2017-01-03 DIAGNOSIS — G43719 Chronic migraine without aura, intractable, without status migrainosus: Secondary | ICD-10-CM | POA: Diagnosis not present

## 2017-01-08 DIAGNOSIS — R569 Unspecified convulsions: Secondary | ICD-10-CM | POA: Diagnosis not present

## 2017-01-08 DIAGNOSIS — R51 Headache: Secondary | ICD-10-CM | POA: Diagnosis not present

## 2017-01-08 DIAGNOSIS — R55 Syncope and collapse: Secondary | ICD-10-CM | POA: Diagnosis not present

## 2017-01-11 DIAGNOSIS — M542 Cervicalgia: Secondary | ICD-10-CM | POA: Diagnosis not present

## 2017-01-11 DIAGNOSIS — M4322 Fusion of spine, cervical region: Secondary | ICD-10-CM | POA: Diagnosis not present

## 2017-01-18 DIAGNOSIS — G43719 Chronic migraine without aura, intractable, without status migrainosus: Secondary | ICD-10-CM | POA: Diagnosis not present

## 2017-01-18 DIAGNOSIS — G43011 Migraine without aura, intractable, with status migrainosus: Secondary | ICD-10-CM | POA: Diagnosis not present

## 2017-01-18 DIAGNOSIS — G4733 Obstructive sleep apnea (adult) (pediatric): Secondary | ICD-10-CM | POA: Diagnosis not present

## 2017-01-18 DIAGNOSIS — G4763 Sleep related bruxism: Secondary | ICD-10-CM | POA: Diagnosis not present

## 2017-03-16 DIAGNOSIS — G43719 Chronic migraine without aura, intractable, without status migrainosus: Secondary | ICD-10-CM | POA: Diagnosis not present

## 2017-03-16 DIAGNOSIS — G44309 Post-traumatic headache, unspecified, not intractable: Secondary | ICD-10-CM | POA: Diagnosis not present

## 2017-03-29 DIAGNOSIS — R031 Nonspecific low blood-pressure reading: Secondary | ICD-10-CM | POA: Diagnosis not present

## 2017-04-07 DIAGNOSIS — G43719 Chronic migraine without aura, intractable, without status migrainosus: Secondary | ICD-10-CM | POA: Diagnosis not present

## 2017-04-12 DIAGNOSIS — G43719 Chronic migraine without aura, intractable, without status migrainosus: Secondary | ICD-10-CM | POA: Diagnosis not present

## 2017-04-12 DIAGNOSIS — G44309 Post-traumatic headache, unspecified, not intractable: Secondary | ICD-10-CM | POA: Diagnosis not present

## 2017-05-24 DIAGNOSIS — Z01419 Encounter for gynecological examination (general) (routine) without abnormal findings: Secondary | ICD-10-CM | POA: Diagnosis not present

## 2017-05-24 DIAGNOSIS — Z1231 Encounter for screening mammogram for malignant neoplasm of breast: Secondary | ICD-10-CM | POA: Diagnosis not present

## 2017-05-24 DIAGNOSIS — Z6830 Body mass index (BMI) 30.0-30.9, adult: Secondary | ICD-10-CM | POA: Diagnosis not present

## 2017-06-06 DIAGNOSIS — R569 Unspecified convulsions: Secondary | ICD-10-CM | POA: Diagnosis not present

## 2017-06-26 DIAGNOSIS — G43719 Chronic migraine without aura, intractable, without status migrainosus: Secondary | ICD-10-CM | POA: Diagnosis not present

## 2017-07-11 DIAGNOSIS — G44309 Post-traumatic headache, unspecified, not intractable: Secondary | ICD-10-CM | POA: Diagnosis not present

## 2017-07-11 DIAGNOSIS — G43719 Chronic migraine without aura, intractable, without status migrainosus: Secondary | ICD-10-CM | POA: Diagnosis not present

## 2017-08-28 DIAGNOSIS — K760 Fatty (change of) liver, not elsewhere classified: Secondary | ICD-10-CM | POA: Diagnosis not present

## 2017-08-28 DIAGNOSIS — G40909 Epilepsy, unspecified, not intractable, without status epilepticus: Secondary | ICD-10-CM | POA: Diagnosis not present

## 2017-08-28 DIAGNOSIS — G43909 Migraine, unspecified, not intractable, without status migrainosus: Secondary | ICD-10-CM | POA: Diagnosis not present

## 2017-08-28 DIAGNOSIS — Z0001 Encounter for general adult medical examination with abnormal findings: Secondary | ICD-10-CM | POA: Diagnosis not present

## 2017-09-20 DIAGNOSIS — G43719 Chronic migraine without aura, intractable, without status migrainosus: Secondary | ICD-10-CM | POA: Diagnosis not present

## 2017-10-03 DIAGNOSIS — R6889 Other general symptoms and signs: Secondary | ICD-10-CM | POA: Diagnosis not present

## 2017-10-03 DIAGNOSIS — J189 Pneumonia, unspecified organism: Secondary | ICD-10-CM | POA: Diagnosis not present

## 2017-10-03 DIAGNOSIS — Z6828 Body mass index (BMI) 28.0-28.9, adult: Secondary | ICD-10-CM | POA: Diagnosis not present

## 2017-10-10 DIAGNOSIS — G43719 Chronic migraine without aura, intractable, without status migrainosus: Secondary | ICD-10-CM | POA: Diagnosis not present

## 2017-10-10 DIAGNOSIS — G44309 Post-traumatic headache, unspecified, not intractable: Secondary | ICD-10-CM | POA: Diagnosis not present

## 2017-10-10 DIAGNOSIS — R52 Pain, unspecified: Secondary | ICD-10-CM | POA: Diagnosis not present

## 2017-12-26 DIAGNOSIS — G43719 Chronic migraine without aura, intractable, without status migrainosus: Secondary | ICD-10-CM | POA: Diagnosis not present

## 2018-01-01 DIAGNOSIS — G43719 Chronic migraine without aura, intractable, without status migrainosus: Secondary | ICD-10-CM | POA: Diagnosis not present

## 2018-01-01 DIAGNOSIS — R52 Pain, unspecified: Secondary | ICD-10-CM | POA: Diagnosis not present

## 2018-01-01 DIAGNOSIS — G44309 Post-traumatic headache, unspecified, not intractable: Secondary | ICD-10-CM | POA: Diagnosis not present

## 2018-03-07 DIAGNOSIS — G43719 Chronic migraine without aura, intractable, without status migrainosus: Secondary | ICD-10-CM | POA: Diagnosis not present

## 2018-03-22 DIAGNOSIS — G43719 Chronic migraine without aura, intractable, without status migrainosus: Secondary | ICD-10-CM | POA: Diagnosis not present

## 2018-03-22 DIAGNOSIS — G44309 Post-traumatic headache, unspecified, not intractable: Secondary | ICD-10-CM | POA: Diagnosis not present

## 2018-03-22 DIAGNOSIS — R52 Pain, unspecified: Secondary | ICD-10-CM | POA: Diagnosis not present

## 2018-06-05 DIAGNOSIS — G43719 Chronic migraine without aura, intractable, without status migrainosus: Secondary | ICD-10-CM | POA: Diagnosis not present

## 2018-06-06 DIAGNOSIS — Z6829 Body mass index (BMI) 29.0-29.9, adult: Secondary | ICD-10-CM | POA: Diagnosis not present

## 2018-06-06 DIAGNOSIS — G43111 Migraine with aura, intractable, with status migrainosus: Secondary | ICD-10-CM | POA: Diagnosis not present

## 2018-06-14 DIAGNOSIS — G43719 Chronic migraine without aura, intractable, without status migrainosus: Secondary | ICD-10-CM | POA: Diagnosis not present

## 2018-06-14 DIAGNOSIS — G44309 Post-traumatic headache, unspecified, not intractable: Secondary | ICD-10-CM | POA: Diagnosis not present

## 2018-06-14 DIAGNOSIS — R52 Pain, unspecified: Secondary | ICD-10-CM | POA: Diagnosis not present

## 2018-07-16 DIAGNOSIS — Z01419 Encounter for gynecological examination (general) (routine) without abnormal findings: Secondary | ICD-10-CM | POA: Diagnosis not present

## 2018-07-16 DIAGNOSIS — Z1212 Encounter for screening for malignant neoplasm of rectum: Secondary | ICD-10-CM | POA: Diagnosis not present

## 2018-07-16 DIAGNOSIS — Z1231 Encounter for screening mammogram for malignant neoplasm of breast: Secondary | ICD-10-CM | POA: Diagnosis not present

## 2018-07-16 DIAGNOSIS — Z6831 Body mass index (BMI) 31.0-31.9, adult: Secondary | ICD-10-CM | POA: Diagnosis not present

## 2018-07-27 DIAGNOSIS — G43111 Migraine with aura, intractable, with status migrainosus: Secondary | ICD-10-CM | POA: Diagnosis not present

## 2018-07-27 DIAGNOSIS — J029 Acute pharyngitis, unspecified: Secondary | ICD-10-CM | POA: Diagnosis not present

## 2018-07-30 DIAGNOSIS — S63501A Unspecified sprain of right wrist, initial encounter: Secondary | ICD-10-CM | POA: Diagnosis not present

## 2018-08-23 DIAGNOSIS — G44329 Chronic post-traumatic headache, not intractable: Secondary | ICD-10-CM | POA: Diagnosis not present

## 2018-08-23 DIAGNOSIS — R52 Pain, unspecified: Secondary | ICD-10-CM | POA: Diagnosis not present

## 2018-08-23 DIAGNOSIS — G43719 Chronic migraine without aura, intractable, without status migrainosus: Secondary | ICD-10-CM | POA: Diagnosis not present

## 2018-08-31 DIAGNOSIS — G43719 Chronic migraine without aura, intractable, without status migrainosus: Secondary | ICD-10-CM | POA: Diagnosis not present

## 2018-10-22 DIAGNOSIS — Z6829 Body mass index (BMI) 29.0-29.9, adult: Secondary | ICD-10-CM | POA: Diagnosis not present

## 2018-10-22 DIAGNOSIS — G43111 Migraine with aura, intractable, with status migrainosus: Secondary | ICD-10-CM | POA: Diagnosis not present

## 2018-11-01 DIAGNOSIS — M5481 Occipital neuralgia: Secondary | ICD-10-CM | POA: Diagnosis not present

## 2018-11-01 DIAGNOSIS — M792 Neuralgia and neuritis, unspecified: Secondary | ICD-10-CM | POA: Diagnosis not present

## 2018-11-01 DIAGNOSIS — G43719 Chronic migraine without aura, intractable, without status migrainosus: Secondary | ICD-10-CM | POA: Diagnosis not present

## 2018-11-01 DIAGNOSIS — G508 Other disorders of trigeminal nerve: Secondary | ICD-10-CM | POA: Diagnosis not present

## 2018-11-01 DIAGNOSIS — R52 Pain, unspecified: Secondary | ICD-10-CM | POA: Diagnosis not present

## 2018-11-07 DIAGNOSIS — G43719 Chronic migraine without aura, intractable, without status migrainosus: Secondary | ICD-10-CM | POA: Diagnosis not present

## 2018-11-15 DIAGNOSIS — G43719 Chronic migraine without aura, intractable, without status migrainosus: Secondary | ICD-10-CM | POA: Diagnosis not present

## 2018-11-15 DIAGNOSIS — R52 Pain, unspecified: Secondary | ICD-10-CM | POA: Diagnosis not present

## 2019-01-24 DIAGNOSIS — G43111 Migraine with aura, intractable, with status migrainosus: Secondary | ICD-10-CM | POA: Diagnosis not present

## 2019-01-25 DIAGNOSIS — G43719 Chronic migraine without aura, intractable, without status migrainosus: Secondary | ICD-10-CM | POA: Diagnosis not present

## 2019-02-05 DIAGNOSIS — R52 Pain, unspecified: Secondary | ICD-10-CM | POA: Diagnosis not present

## 2019-02-05 DIAGNOSIS — G43719 Chronic migraine without aura, intractable, without status migrainosus: Secondary | ICD-10-CM | POA: Diagnosis not present

## 2019-04-19 DIAGNOSIS — G43719 Chronic migraine without aura, intractable, without status migrainosus: Secondary | ICD-10-CM | POA: Diagnosis not present

## 2019-05-02 DIAGNOSIS — G43719 Chronic migraine without aura, intractable, without status migrainosus: Secondary | ICD-10-CM | POA: Diagnosis not present

## 2019-05-02 DIAGNOSIS — M5481 Occipital neuralgia: Secondary | ICD-10-CM | POA: Diagnosis not present

## 2019-05-02 DIAGNOSIS — R52 Pain, unspecified: Secondary | ICD-10-CM | POA: Diagnosis not present

## 2019-06-26 DIAGNOSIS — G44309 Post-traumatic headache, unspecified, not intractable: Secondary | ICD-10-CM | POA: Diagnosis not present

## 2019-06-26 DIAGNOSIS — R52 Pain, unspecified: Secondary | ICD-10-CM | POA: Diagnosis not present

## 2019-06-26 DIAGNOSIS — G43719 Chronic migraine without aura, intractable, without status migrainosus: Secondary | ICD-10-CM | POA: Diagnosis not present

## 2019-06-28 DIAGNOSIS — R52 Pain, unspecified: Secondary | ICD-10-CM | POA: Diagnosis not present

## 2019-06-28 DIAGNOSIS — G43719 Chronic migraine without aura, intractable, without status migrainosus: Secondary | ICD-10-CM | POA: Diagnosis not present

## 2019-06-28 DIAGNOSIS — M5481 Occipital neuralgia: Secondary | ICD-10-CM | POA: Diagnosis not present

## 2019-06-28 DIAGNOSIS — M792 Neuralgia and neuritis, unspecified: Secondary | ICD-10-CM | POA: Diagnosis not present

## 2019-06-28 DIAGNOSIS — G500 Trigeminal neuralgia: Secondary | ICD-10-CM | POA: Diagnosis not present

## 2019-07-16 ENCOUNTER — Other Ambulatory Visit: Payer: Self-pay | Admitting: Radiology

## 2019-07-16 DIAGNOSIS — K760 Fatty (change of) liver, not elsewhere classified: Secondary | ICD-10-CM | POA: Diagnosis not present

## 2019-07-16 DIAGNOSIS — Z86010 Personal history of colonic polyps: Secondary | ICD-10-CM | POA: Diagnosis not present

## 2019-07-16 DIAGNOSIS — Z1211 Encounter for screening for malignant neoplasm of colon: Secondary | ICD-10-CM | POA: Diagnosis not present

## 2019-07-16 DIAGNOSIS — K219 Gastro-esophageal reflux disease without esophagitis: Secondary | ICD-10-CM | POA: Diagnosis not present

## 2019-07-16 DIAGNOSIS — N6320 Unspecified lump in the left breast, unspecified quadrant: Secondary | ICD-10-CM

## 2019-07-24 ENCOUNTER — Other Ambulatory Visit: Payer: Self-pay

## 2019-07-24 ENCOUNTER — Ambulatory Visit
Admission: RE | Admit: 2019-07-24 | Discharge: 2019-07-24 | Disposition: A | Discharge status: Home / Self Care | Payer: BC Managed Care – PPO | Source: Ambulatory Visit | Attending: Radiology | Admitting: Radiology

## 2019-07-24 DIAGNOSIS — N6320 Unspecified lump in the left breast, unspecified quadrant: Secondary | ICD-10-CM

## 2019-07-24 DIAGNOSIS — R922 Inconclusive mammogram: Secondary | ICD-10-CM | POA: Diagnosis not present

## 2019-07-24 DIAGNOSIS — N6489 Other specified disorders of breast: Secondary | ICD-10-CM | POA: Diagnosis not present

## 2019-07-29 DIAGNOSIS — K635 Polyp of colon: Secondary | ICD-10-CM | POA: Diagnosis not present

## 2019-07-29 DIAGNOSIS — D125 Benign neoplasm of sigmoid colon: Secondary | ICD-10-CM | POA: Diagnosis not present

## 2019-07-29 DIAGNOSIS — K6389 Other specified diseases of intestine: Secondary | ICD-10-CM | POA: Diagnosis not present

## 2019-07-29 DIAGNOSIS — Z86010 Personal history of colonic polyps: Secondary | ICD-10-CM | POA: Diagnosis not present

## 2019-07-29 DIAGNOSIS — Z1211 Encounter for screening for malignant neoplasm of colon: Secondary | ICD-10-CM | POA: Diagnosis not present

## 2019-07-30 DIAGNOSIS — G43719 Chronic migraine without aura, intractable, without status migrainosus: Secondary | ICD-10-CM | POA: Diagnosis not present

## 2019-08-01 DIAGNOSIS — G43719 Chronic migraine without aura, intractable, without status migrainosus: Secondary | ICD-10-CM | POA: Diagnosis not present

## 2019-08-01 DIAGNOSIS — G44309 Post-traumatic headache, unspecified, not intractable: Secondary | ICD-10-CM | POA: Diagnosis not present

## 2019-08-01 DIAGNOSIS — R52 Pain, unspecified: Secondary | ICD-10-CM | POA: Diagnosis not present

## 2019-08-01 DIAGNOSIS — M5481 Occipital neuralgia: Secondary | ICD-10-CM | POA: Diagnosis not present

## 2019-09-12 DIAGNOSIS — Z20822 Contact with and (suspected) exposure to covid-19: Secondary | ICD-10-CM | POA: Diagnosis not present

## 2019-10-03 DIAGNOSIS — M5481 Occipital neuralgia: Secondary | ICD-10-CM | POA: Diagnosis not present

## 2019-10-03 DIAGNOSIS — R52 Pain, unspecified: Secondary | ICD-10-CM | POA: Diagnosis not present

## 2019-10-03 DIAGNOSIS — G43719 Chronic migraine without aura, intractable, without status migrainosus: Secondary | ICD-10-CM | POA: Diagnosis not present

## 2019-10-03 DIAGNOSIS — M792 Neuralgia and neuritis, unspecified: Secondary | ICD-10-CM | POA: Diagnosis not present

## 2019-10-03 DIAGNOSIS — G500 Trigeminal neuralgia: Secondary | ICD-10-CM | POA: Diagnosis not present

## 2019-10-14 DIAGNOSIS — M47812 Spondylosis without myelopathy or radiculopathy, cervical region: Secondary | ICD-10-CM | POA: Diagnosis not present

## 2019-10-14 DIAGNOSIS — M961 Postlaminectomy syndrome, not elsewhere classified: Secondary | ICD-10-CM | POA: Diagnosis not present

## 2019-10-14 DIAGNOSIS — G4489 Other headache syndrome: Secondary | ICD-10-CM | POA: Diagnosis not present

## 2019-10-14 DIAGNOSIS — Z1331 Encounter for screening for depression: Secondary | ICD-10-CM | POA: Diagnosis not present

## 2019-10-17 DIAGNOSIS — M542 Cervicalgia: Secondary | ICD-10-CM | POA: Diagnosis not present

## 2019-10-17 DIAGNOSIS — M47812 Spondylosis without myelopathy or radiculopathy, cervical region: Secondary | ICD-10-CM | POA: Diagnosis not present

## 2019-10-22 DIAGNOSIS — M4722 Other spondylosis with radiculopathy, cervical region: Secondary | ICD-10-CM | POA: Diagnosis not present

## 2019-10-22 DIAGNOSIS — I10 Essential (primary) hypertension: Secondary | ICD-10-CM | POA: Diagnosis not present

## 2019-10-22 DIAGNOSIS — M5481 Occipital neuralgia: Secondary | ICD-10-CM | POA: Diagnosis not present

## 2019-10-22 DIAGNOSIS — G43719 Chronic migraine without aura, intractable, without status migrainosus: Secondary | ICD-10-CM | POA: Diagnosis not present

## 2019-10-22 DIAGNOSIS — R569 Unspecified convulsions: Secondary | ICD-10-CM | POA: Diagnosis not present

## 2019-10-22 DIAGNOSIS — G500 Trigeminal neuralgia: Secondary | ICD-10-CM | POA: Diagnosis not present

## 2019-10-22 DIAGNOSIS — M47812 Spondylosis without myelopathy or radiculopathy, cervical region: Secondary | ICD-10-CM | POA: Diagnosis not present

## 2019-10-22 DIAGNOSIS — Z1331 Encounter for screening for depression: Secondary | ICD-10-CM | POA: Diagnosis not present

## 2019-10-22 DIAGNOSIS — R519 Headache, unspecified: Secondary | ICD-10-CM | POA: Diagnosis not present

## 2019-10-22 DIAGNOSIS — E559 Vitamin D deficiency, unspecified: Secondary | ICD-10-CM | POA: Diagnosis not present

## 2019-10-22 DIAGNOSIS — G43909 Migraine, unspecified, not intractable, without status migrainosus: Secondary | ICD-10-CM | POA: Diagnosis not present

## 2019-10-22 DIAGNOSIS — G459 Transient cerebral ischemic attack, unspecified: Secondary | ICD-10-CM | POA: Diagnosis not present

## 2019-10-22 DIAGNOSIS — R52 Pain, unspecified: Secondary | ICD-10-CM | POA: Diagnosis not present

## 2019-10-22 DIAGNOSIS — K760 Fatty (change of) liver, not elsewhere classified: Secondary | ICD-10-CM | POA: Diagnosis not present

## 2019-10-22 DIAGNOSIS — S0502XA Injury of conjunctiva and corneal abrasion without foreign body, left eye, initial encounter: Secondary | ICD-10-CM | POA: Diagnosis not present

## 2019-10-22 DIAGNOSIS — G44309 Post-traumatic headache, unspecified, not intractable: Secondary | ICD-10-CM | POA: Diagnosis not present

## 2019-10-22 DIAGNOSIS — M542 Cervicalgia: Secondary | ICD-10-CM | POA: Diagnosis not present

## 2019-10-22 DIAGNOSIS — M792 Neuralgia and neuritis, unspecified: Secondary | ICD-10-CM | POA: Diagnosis not present

## 2019-10-24 DIAGNOSIS — R52 Pain, unspecified: Secondary | ICD-10-CM | POA: Diagnosis not present

## 2019-10-24 DIAGNOSIS — G44309 Post-traumatic headache, unspecified, not intractable: Secondary | ICD-10-CM | POA: Diagnosis not present

## 2019-10-24 DIAGNOSIS — G43719 Chronic migraine without aura, intractable, without status migrainosus: Secondary | ICD-10-CM | POA: Diagnosis not present

## 2019-10-30 DIAGNOSIS — M542 Cervicalgia: Secondary | ICD-10-CM | POA: Diagnosis not present

## 2019-10-30 DIAGNOSIS — M47812 Spondylosis without myelopathy or radiculopathy, cervical region: Secondary | ICD-10-CM | POA: Diagnosis not present

## 2019-11-07 DIAGNOSIS — R569 Unspecified convulsions: Secondary | ICD-10-CM | POA: Diagnosis not present

## 2019-11-07 DIAGNOSIS — R519 Headache, unspecified: Secondary | ICD-10-CM | POA: Diagnosis not present

## 2019-11-14 DIAGNOSIS — M47812 Spondylosis without myelopathy or radiculopathy, cervical region: Secondary | ICD-10-CM | POA: Diagnosis not present

## 2019-11-14 DIAGNOSIS — M542 Cervicalgia: Secondary | ICD-10-CM | POA: Diagnosis not present

## 2019-11-19 DIAGNOSIS — M542 Cervicalgia: Secondary | ICD-10-CM | POA: Diagnosis not present

## 2019-11-19 DIAGNOSIS — M47812 Spondylosis without myelopathy or radiculopathy, cervical region: Secondary | ICD-10-CM | POA: Diagnosis not present

## 2019-11-26 DIAGNOSIS — M542 Cervicalgia: Secondary | ICD-10-CM | POA: Diagnosis not present

## 2019-11-26 DIAGNOSIS — M47812 Spondylosis without myelopathy or radiculopathy, cervical region: Secondary | ICD-10-CM | POA: Diagnosis not present

## 2019-12-05 DIAGNOSIS — M47812 Spondylosis without myelopathy or radiculopathy, cervical region: Secondary | ICD-10-CM | POA: Diagnosis not present

## 2019-12-05 DIAGNOSIS — M542 Cervicalgia: Secondary | ICD-10-CM | POA: Diagnosis not present

## 2019-12-09 DIAGNOSIS — M961 Postlaminectomy syndrome, not elsewhere classified: Secondary | ICD-10-CM | POA: Diagnosis not present

## 2019-12-09 DIAGNOSIS — M47812 Spondylosis without myelopathy or radiculopathy, cervical region: Secondary | ICD-10-CM | POA: Diagnosis not present

## 2019-12-09 DIAGNOSIS — G4489 Other headache syndrome: Secondary | ICD-10-CM | POA: Diagnosis not present

## 2019-12-10 DIAGNOSIS — G43719 Chronic migraine without aura, intractable, without status migrainosus: Secondary | ICD-10-CM | POA: Diagnosis not present

## 2019-12-10 DIAGNOSIS — G44309 Post-traumatic headache, unspecified, not intractable: Secondary | ICD-10-CM | POA: Diagnosis not present

## 2019-12-10 DIAGNOSIS — R52 Pain, unspecified: Secondary | ICD-10-CM | POA: Diagnosis not present

## 2019-12-12 DIAGNOSIS — M542 Cervicalgia: Secondary | ICD-10-CM | POA: Diagnosis not present

## 2019-12-12 DIAGNOSIS — M47812 Spondylosis without myelopathy or radiculopathy, cervical region: Secondary | ICD-10-CM | POA: Diagnosis not present

## 2019-12-18 DIAGNOSIS — M4722 Other spondylosis with radiculopathy, cervical region: Secondary | ICD-10-CM | POA: Diagnosis not present

## 2019-12-19 DIAGNOSIS — M542 Cervicalgia: Secondary | ICD-10-CM | POA: Diagnosis not present

## 2019-12-19 DIAGNOSIS — M47812 Spondylosis without myelopathy or radiculopathy, cervical region: Secondary | ICD-10-CM | POA: Diagnosis not present

## 2019-12-21 DIAGNOSIS — I10 Essential (primary) hypertension: Secondary | ICD-10-CM | POA: Diagnosis not present

## 2019-12-21 DIAGNOSIS — S0502XA Injury of conjunctiva and corneal abrasion without foreign body, left eye, initial encounter: Secondary | ICD-10-CM | POA: Diagnosis not present

## 2019-12-21 DIAGNOSIS — G43909 Migraine, unspecified, not intractable, without status migrainosus: Secondary | ICD-10-CM | POA: Diagnosis not present

## 2019-12-24 DIAGNOSIS — M5030 Other cervical disc degeneration, unspecified cervical region: Secondary | ICD-10-CM | POA: Diagnosis not present

## 2019-12-24 DIAGNOSIS — M47812 Spondylosis without myelopathy or radiculopathy, cervical region: Secondary | ICD-10-CM | POA: Diagnosis not present

## 2019-12-24 DIAGNOSIS — M961 Postlaminectomy syndrome, not elsewhere classified: Secondary | ICD-10-CM | POA: Diagnosis not present

## 2019-12-30 DIAGNOSIS — E559 Vitamin D deficiency, unspecified: Secondary | ICD-10-CM | POA: Diagnosis not present

## 2019-12-30 DIAGNOSIS — G43909 Migraine, unspecified, not intractable, without status migrainosus: Secondary | ICD-10-CM | POA: Diagnosis not present

## 2019-12-30 DIAGNOSIS — Z1331 Encounter for screening for depression: Secondary | ICD-10-CM | POA: Diagnosis not present

## 2019-12-30 DIAGNOSIS — K760 Fatty (change of) liver, not elsewhere classified: Secondary | ICD-10-CM | POA: Diagnosis not present

## 2019-12-30 DIAGNOSIS — G459 Transient cerebral ischemic attack, unspecified: Secondary | ICD-10-CM | POA: Diagnosis not present

## 2020-01-02 DIAGNOSIS — G43719 Chronic migraine without aura, intractable, without status migrainosus: Secondary | ICD-10-CM | POA: Diagnosis not present

## 2020-01-02 DIAGNOSIS — M5481 Occipital neuralgia: Secondary | ICD-10-CM | POA: Diagnosis not present

## 2020-01-02 DIAGNOSIS — M792 Neuralgia and neuritis, unspecified: Secondary | ICD-10-CM | POA: Diagnosis not present

## 2020-01-02 DIAGNOSIS — G500 Trigeminal neuralgia: Secondary | ICD-10-CM | POA: Diagnosis not present

## 2020-01-02 DIAGNOSIS — R52 Pain, unspecified: Secondary | ICD-10-CM | POA: Diagnosis not present

## 2020-01-08 DIAGNOSIS — M5412 Radiculopathy, cervical region: Secondary | ICD-10-CM | POA: Diagnosis not present

## 2020-01-08 DIAGNOSIS — M5030 Other cervical disc degeneration, unspecified cervical region: Secondary | ICD-10-CM | POA: Diagnosis not present

## 2020-01-08 DIAGNOSIS — G43719 Chronic migraine without aura, intractable, without status migrainosus: Secondary | ICD-10-CM | POA: Diagnosis not present

## 2020-01-08 DIAGNOSIS — M961 Postlaminectomy syndrome, not elsewhere classified: Secondary | ICD-10-CM | POA: Diagnosis not present

## 2020-01-20 DIAGNOSIS — R52 Pain, unspecified: Secondary | ICD-10-CM | POA: Diagnosis not present

## 2020-01-20 DIAGNOSIS — G43719 Chronic migraine without aura, intractable, without status migrainosus: Secondary | ICD-10-CM | POA: Diagnosis not present

## 2020-01-20 DIAGNOSIS — G932 Benign intracranial hypertension: Secondary | ICD-10-CM | POA: Diagnosis not present

## 2020-01-20 DIAGNOSIS — G44309 Post-traumatic headache, unspecified, not intractable: Secondary | ICD-10-CM | POA: Diagnosis not present

## 2020-01-22 DIAGNOSIS — M47812 Spondylosis without myelopathy or radiculopathy, cervical region: Secondary | ICD-10-CM | POA: Diagnosis not present

## 2020-01-22 DIAGNOSIS — M961 Postlaminectomy syndrome, not elsewhere classified: Secondary | ICD-10-CM | POA: Diagnosis not present

## 2020-01-22 DIAGNOSIS — G4489 Other headache syndrome: Secondary | ICD-10-CM | POA: Diagnosis not present

## 2020-01-22 DIAGNOSIS — G40909 Epilepsy, unspecified, not intractable, without status epilepticus: Secondary | ICD-10-CM | POA: Diagnosis not present

## 2020-02-25 DIAGNOSIS — M5030 Other cervical disc degeneration, unspecified cervical region: Secondary | ICD-10-CM | POA: Diagnosis not present

## 2020-02-25 DIAGNOSIS — M961 Postlaminectomy syndrome, not elsewhere classified: Secondary | ICD-10-CM | POA: Diagnosis not present

## 2020-02-25 DIAGNOSIS — M5412 Radiculopathy, cervical region: Secondary | ICD-10-CM | POA: Diagnosis not present

## 2020-03-27 DIAGNOSIS — M792 Neuralgia and neuritis, unspecified: Secondary | ICD-10-CM | POA: Diagnosis not present

## 2020-03-27 DIAGNOSIS — G43911 Migraine, unspecified, intractable, with status migrainosus: Secondary | ICD-10-CM | POA: Diagnosis not present

## 2020-03-27 DIAGNOSIS — Z1339 Encounter for screening examination for other mental health and behavioral disorders: Secondary | ICD-10-CM | POA: Diagnosis not present

## 2020-03-27 DIAGNOSIS — M5481 Occipital neuralgia: Secondary | ICD-10-CM | POA: Diagnosis not present

## 2020-03-27 DIAGNOSIS — G508 Other disorders of trigeminal nerve: Secondary | ICD-10-CM | POA: Diagnosis not present

## 2020-03-27 DIAGNOSIS — Z1331 Encounter for screening for depression: Secondary | ICD-10-CM | POA: Diagnosis not present

## 2020-03-27 DIAGNOSIS — M62838 Other muscle spasm: Secondary | ICD-10-CM | POA: Diagnosis not present

## 2020-03-31 DIAGNOSIS — M47812 Spondylosis without myelopathy or radiculopathy, cervical region: Secondary | ICD-10-CM | POA: Diagnosis not present

## 2020-03-31 DIAGNOSIS — G4489 Other headache syndrome: Secondary | ICD-10-CM | POA: Diagnosis not present

## 2020-03-31 DIAGNOSIS — G40909 Epilepsy, unspecified, not intractable, without status epilepticus: Secondary | ICD-10-CM | POA: Diagnosis not present

## 2020-03-31 DIAGNOSIS — M961 Postlaminectomy syndrome, not elsewhere classified: Secondary | ICD-10-CM | POA: Diagnosis not present

## 2020-03-31 DIAGNOSIS — G43719 Chronic migraine without aura, intractable, without status migrainosus: Secondary | ICD-10-CM | POA: Diagnosis not present

## 2020-04-15 DIAGNOSIS — M47812 Spondylosis without myelopathy or radiculopathy, cervical region: Secondary | ICD-10-CM | POA: Diagnosis not present

## 2020-04-15 DIAGNOSIS — G40909 Epilepsy, unspecified, not intractable, without status epilepticus: Secondary | ICD-10-CM | POA: Diagnosis not present

## 2020-04-15 DIAGNOSIS — M961 Postlaminectomy syndrome, not elsewhere classified: Secondary | ICD-10-CM | POA: Diagnosis not present

## 2020-04-15 DIAGNOSIS — G4489 Other headache syndrome: Secondary | ICD-10-CM | POA: Diagnosis not present

## 2020-04-16 DIAGNOSIS — Z1331 Encounter for screening for depression: Secondary | ICD-10-CM | POA: Diagnosis not present

## 2020-04-16 DIAGNOSIS — G43719 Chronic migraine without aura, intractable, without status migrainosus: Secondary | ICD-10-CM | POA: Diagnosis not present

## 2020-04-16 DIAGNOSIS — M531 Cervicobrachial syndrome: Secondary | ICD-10-CM | POA: Diagnosis not present

## 2020-05-06 DIAGNOSIS — G43709 Chronic migraine without aura, not intractable, without status migrainosus: Secondary | ICD-10-CM | POA: Diagnosis not present

## 2020-05-06 DIAGNOSIS — G44329 Chronic post-traumatic headache, not intractable: Secondary | ICD-10-CM | POA: Diagnosis not present

## 2020-05-22 DIAGNOSIS — M5412 Radiculopathy, cervical region: Secondary | ICD-10-CM | POA: Diagnosis not present

## 2020-06-11 DIAGNOSIS — Z981 Arthrodesis status: Secondary | ICD-10-CM | POA: Diagnosis not present

## 2020-06-11 DIAGNOSIS — G8918 Other acute postprocedural pain: Secondary | ICD-10-CM | POA: Diagnosis not present

## 2020-06-11 DIAGNOSIS — M5412 Radiculopathy, cervical region: Secondary | ICD-10-CM | POA: Diagnosis not present

## 2020-06-26 DIAGNOSIS — M62838 Other muscle spasm: Secondary | ICD-10-CM | POA: Diagnosis not present

## 2020-06-26 DIAGNOSIS — G243 Spasmodic torticollis: Secondary | ICD-10-CM | POA: Diagnosis not present

## 2020-06-26 DIAGNOSIS — G508 Other disorders of trigeminal nerve: Secondary | ICD-10-CM | POA: Diagnosis not present

## 2020-06-26 DIAGNOSIS — M792 Neuralgia and neuritis, unspecified: Secondary | ICD-10-CM | POA: Diagnosis not present

## 2020-06-26 DIAGNOSIS — Z1331 Encounter for screening for depression: Secondary | ICD-10-CM | POA: Diagnosis not present

## 2020-06-26 DIAGNOSIS — G43719 Chronic migraine without aura, intractable, without status migrainosus: Secondary | ICD-10-CM | POA: Diagnosis not present

## 2020-06-26 DIAGNOSIS — H5203 Hypermetropia, bilateral: Secondary | ICD-10-CM | POA: Diagnosis not present

## 2020-06-26 DIAGNOSIS — M5481 Occipital neuralgia: Secondary | ICD-10-CM | POA: Diagnosis not present

## 2020-06-26 DIAGNOSIS — R7303 Prediabetes: Secondary | ICD-10-CM | POA: Diagnosis not present

## 2020-07-07 DIAGNOSIS — R7303 Prediabetes: Secondary | ICD-10-CM | POA: Diagnosis not present

## 2020-07-07 DIAGNOSIS — H5203 Hypermetropia, bilateral: Secondary | ICD-10-CM | POA: Diagnosis not present

## 2020-07-13 DIAGNOSIS — G243 Spasmodic torticollis: Secondary | ICD-10-CM | POA: Diagnosis not present

## 2020-07-13 DIAGNOSIS — M531 Cervicobrachial syndrome: Secondary | ICD-10-CM | POA: Diagnosis not present

## 2020-07-13 DIAGNOSIS — Z1331 Encounter for screening for depression: Secondary | ICD-10-CM | POA: Diagnosis not present

## 2020-07-13 DIAGNOSIS — G43719 Chronic migraine without aura, intractable, without status migrainosus: Secondary | ICD-10-CM | POA: Diagnosis not present

## 2020-07-21 DIAGNOSIS — R4781 Slurred speech: Secondary | ICD-10-CM | POA: Diagnosis not present

## 2020-07-21 DIAGNOSIS — H02402 Unspecified ptosis of left eyelid: Secondary | ICD-10-CM | POA: Diagnosis not present

## 2020-07-22 DIAGNOSIS — G9389 Other specified disorders of brain: Secondary | ICD-10-CM | POA: Diagnosis not present

## 2020-07-22 DIAGNOSIS — H02402 Unspecified ptosis of left eyelid: Secondary | ICD-10-CM | POA: Diagnosis not present

## 2020-07-24 DIAGNOSIS — F909 Attention-deficit hyperactivity disorder, unspecified type: Secondary | ICD-10-CM | POA: Diagnosis not present

## 2020-07-24 DIAGNOSIS — G43709 Chronic migraine without aura, not intractable, without status migrainosus: Secondary | ICD-10-CM | POA: Diagnosis not present

## 2020-07-24 DIAGNOSIS — H02402 Unspecified ptosis of left eyelid: Secondary | ICD-10-CM | POA: Diagnosis not present

## 2020-07-24 DIAGNOSIS — R110 Nausea: Secondary | ICD-10-CM | POA: Diagnosis not present

## 2020-07-24 DIAGNOSIS — E041 Nontoxic single thyroid nodule: Secondary | ICD-10-CM | POA: Diagnosis not present

## 2020-07-24 DIAGNOSIS — G4700 Insomnia, unspecified: Secondary | ICD-10-CM | POA: Diagnosis not present

## 2020-07-24 DIAGNOSIS — M542 Cervicalgia: Secondary | ICD-10-CM | POA: Diagnosis not present

## 2020-07-24 DIAGNOSIS — G44329 Chronic post-traumatic headache, not intractable: Secondary | ICD-10-CM | POA: Diagnosis not present

## 2020-07-24 DIAGNOSIS — R42 Dizziness and giddiness: Secondary | ICD-10-CM | POA: Diagnosis not present

## 2020-07-24 DIAGNOSIS — G4733 Obstructive sleep apnea (adult) (pediatric): Secondary | ICD-10-CM | POA: Diagnosis not present

## 2020-07-24 DIAGNOSIS — F419 Anxiety disorder, unspecified: Secondary | ICD-10-CM | POA: Diagnosis not present

## 2020-08-04 DIAGNOSIS — G243 Spasmodic torticollis: Secondary | ICD-10-CM | POA: Diagnosis not present

## 2020-08-04 DIAGNOSIS — G43719 Chronic migraine without aura, intractable, without status migrainosus: Secondary | ICD-10-CM | POA: Diagnosis not present

## 2020-09-11 DIAGNOSIS — G508 Other disorders of trigeminal nerve: Secondary | ICD-10-CM | POA: Diagnosis not present

## 2020-09-11 DIAGNOSIS — M792 Neuralgia and neuritis, unspecified: Secondary | ICD-10-CM | POA: Diagnosis not present

## 2020-09-11 DIAGNOSIS — M531 Cervicobrachial syndrome: Secondary | ICD-10-CM | POA: Diagnosis not present

## 2020-09-11 DIAGNOSIS — G43719 Chronic migraine without aura, intractable, without status migrainosus: Secondary | ICD-10-CM | POA: Diagnosis not present

## 2020-09-11 DIAGNOSIS — M62838 Other muscle spasm: Secondary | ICD-10-CM | POA: Diagnosis not present

## 2020-09-11 DIAGNOSIS — M5481 Occipital neuralgia: Secondary | ICD-10-CM | POA: Diagnosis not present

## 2020-09-11 DIAGNOSIS — R52 Pain, unspecified: Secondary | ICD-10-CM | POA: Diagnosis not present

## 2020-10-13 DIAGNOSIS — M531 Cervicobrachial syndrome: Secondary | ICD-10-CM | POA: Diagnosis not present

## 2020-10-13 DIAGNOSIS — G43719 Chronic migraine without aura, intractable, without status migrainosus: Secondary | ICD-10-CM | POA: Diagnosis not present

## 2020-10-13 DIAGNOSIS — R52 Pain, unspecified: Secondary | ICD-10-CM | POA: Diagnosis not present

## 2020-10-13 DIAGNOSIS — M62838 Other muscle spasm: Secondary | ICD-10-CM | POA: Diagnosis not present

## 2020-10-13 DIAGNOSIS — G243 Spasmodic torticollis: Secondary | ICD-10-CM | POA: Diagnosis not present

## 2020-10-15 DIAGNOSIS — M62838 Other muscle spasm: Secondary | ICD-10-CM | POA: Diagnosis not present

## 2020-10-15 DIAGNOSIS — G43719 Chronic migraine without aura, intractable, without status migrainosus: Secondary | ICD-10-CM | POA: Diagnosis not present

## 2020-10-15 DIAGNOSIS — M531 Cervicobrachial syndrome: Secondary | ICD-10-CM | POA: Diagnosis not present

## 2020-10-15 DIAGNOSIS — G243 Spasmodic torticollis: Secondary | ICD-10-CM | POA: Diagnosis not present

## 2020-10-15 DIAGNOSIS — R52 Pain, unspecified: Secondary | ICD-10-CM | POA: Diagnosis not present

## 2020-11-23 DIAGNOSIS — Z6827 Body mass index (BMI) 27.0-27.9, adult: Secondary | ICD-10-CM | POA: Diagnosis not present

## 2020-11-23 DIAGNOSIS — G43111 Migraine with aura, intractable, with status migrainosus: Secondary | ICD-10-CM | POA: Diagnosis not present

## 2020-12-03 DIAGNOSIS — M5412 Radiculopathy, cervical region: Secondary | ICD-10-CM | POA: Diagnosis not present

## 2020-12-03 DIAGNOSIS — M542 Cervicalgia: Secondary | ICD-10-CM | POA: Diagnosis not present

## 2020-12-03 DIAGNOSIS — Z981 Arthrodesis status: Secondary | ICD-10-CM | POA: Diagnosis not present

## 2020-12-18 DIAGNOSIS — M792 Neuralgia and neuritis, unspecified: Secondary | ICD-10-CM | POA: Diagnosis not present

## 2020-12-18 DIAGNOSIS — M5481 Occipital neuralgia: Secondary | ICD-10-CM | POA: Diagnosis not present

## 2020-12-18 DIAGNOSIS — G43911 Migraine, unspecified, intractable, with status migrainosus: Secondary | ICD-10-CM | POA: Diagnosis not present

## 2020-12-18 DIAGNOSIS — M62838 Other muscle spasm: Secondary | ICD-10-CM | POA: Diagnosis not present

## 2020-12-18 DIAGNOSIS — R52 Pain, unspecified: Secondary | ICD-10-CM | POA: Diagnosis not present

## 2020-12-23 DIAGNOSIS — G43911 Migraine, unspecified, intractable, with status migrainosus: Secondary | ICD-10-CM | POA: Diagnosis not present

## 2020-12-23 DIAGNOSIS — R52 Pain, unspecified: Secondary | ICD-10-CM | POA: Diagnosis not present

## 2020-12-23 DIAGNOSIS — G43719 Chronic migraine without aura, intractable, without status migrainosus: Secondary | ICD-10-CM | POA: Diagnosis not present

## 2020-12-23 DIAGNOSIS — G243 Spasmodic torticollis: Secondary | ICD-10-CM | POA: Diagnosis not present

## 2021-01-04 DIAGNOSIS — R52 Pain, unspecified: Secondary | ICD-10-CM | POA: Diagnosis not present

## 2021-01-04 DIAGNOSIS — G243 Spasmodic torticollis: Secondary | ICD-10-CM | POA: Diagnosis not present

## 2021-01-04 DIAGNOSIS — G43911 Migraine, unspecified, intractable, with status migrainosus: Secondary | ICD-10-CM | POA: Diagnosis not present

## 2021-01-04 DIAGNOSIS — G43719 Chronic migraine without aura, intractable, without status migrainosus: Secondary | ICD-10-CM | POA: Diagnosis not present

## 2021-02-17 IMAGING — MG DIGITAL DIAGNOSTIC BILAT W/ TOMO W/ CAD
6 of 10 series · 6 of 30 positions shown · non-contrast
Comparison: Previous exam(s).

CLINICAL DATA: Palpable lump in the left breast.

EXAM:
DIGITAL DIAGNOSTIC BILATERAL MAMMOGRAM WITH CAD AND TOMO
ULTRASOUND LEFT BREAST

[R MLO synth-2D]
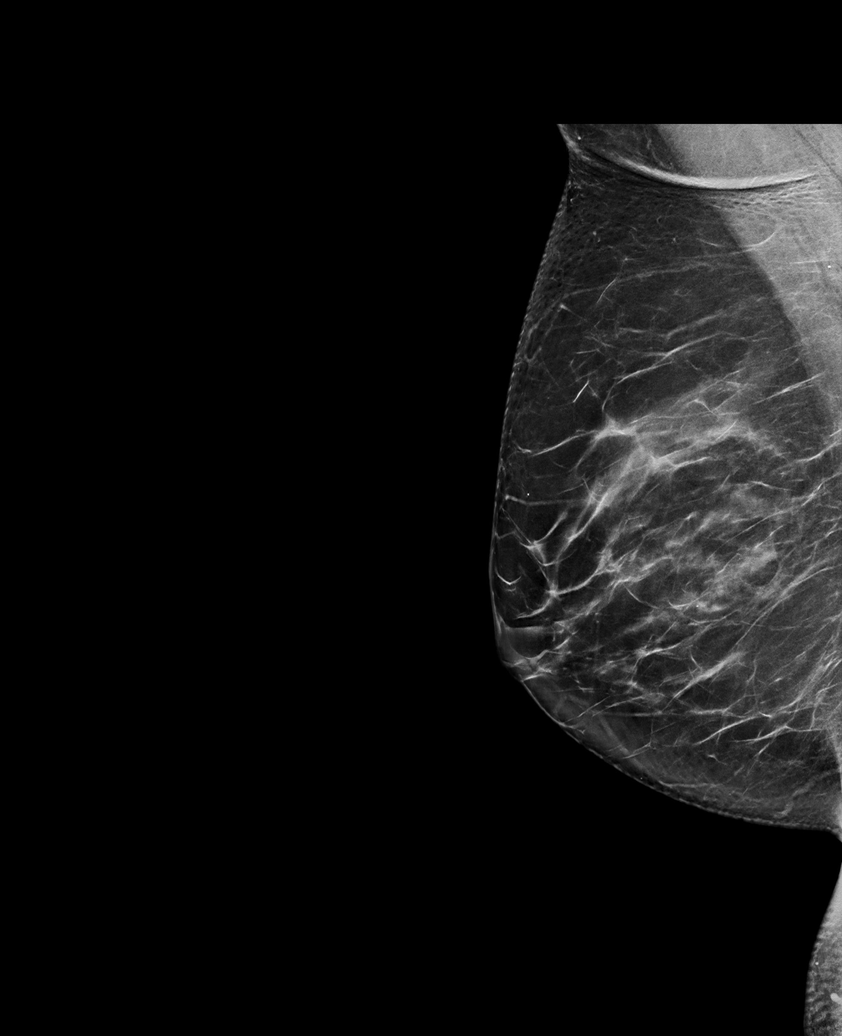

[L MLO synth-2D]
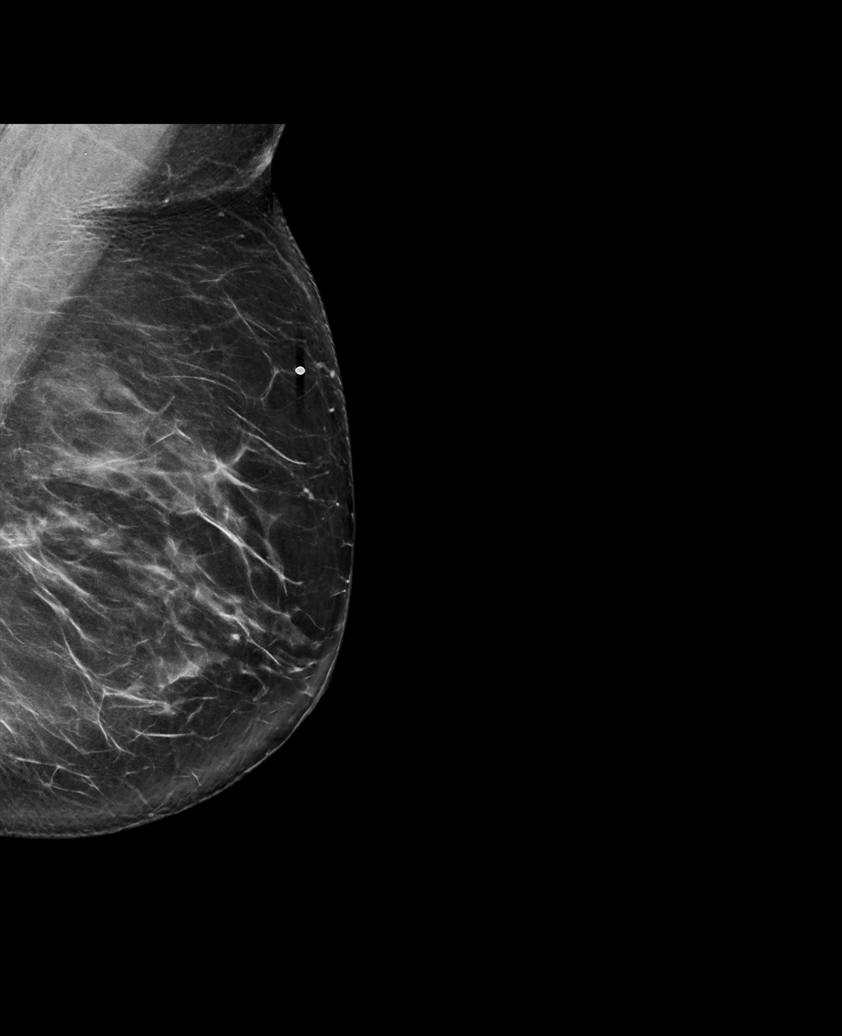

[L CC synth-2D (1 of 2)]
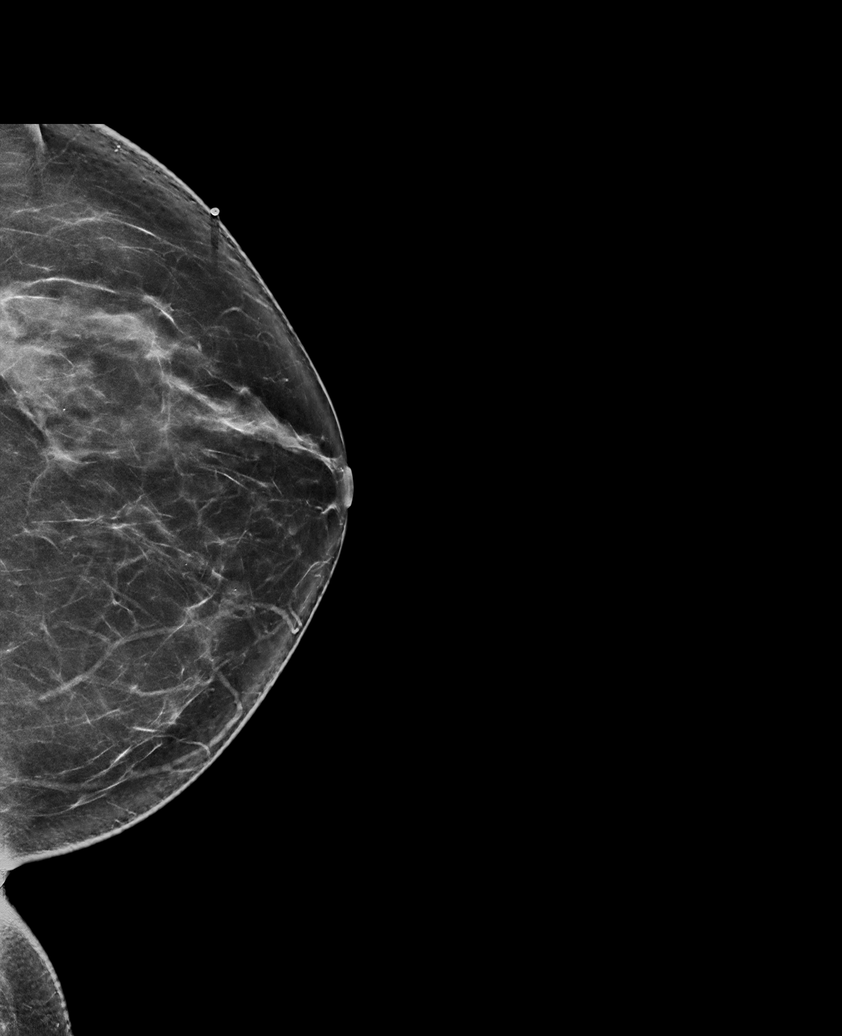

[L CC synth-2D (2 of 2)]
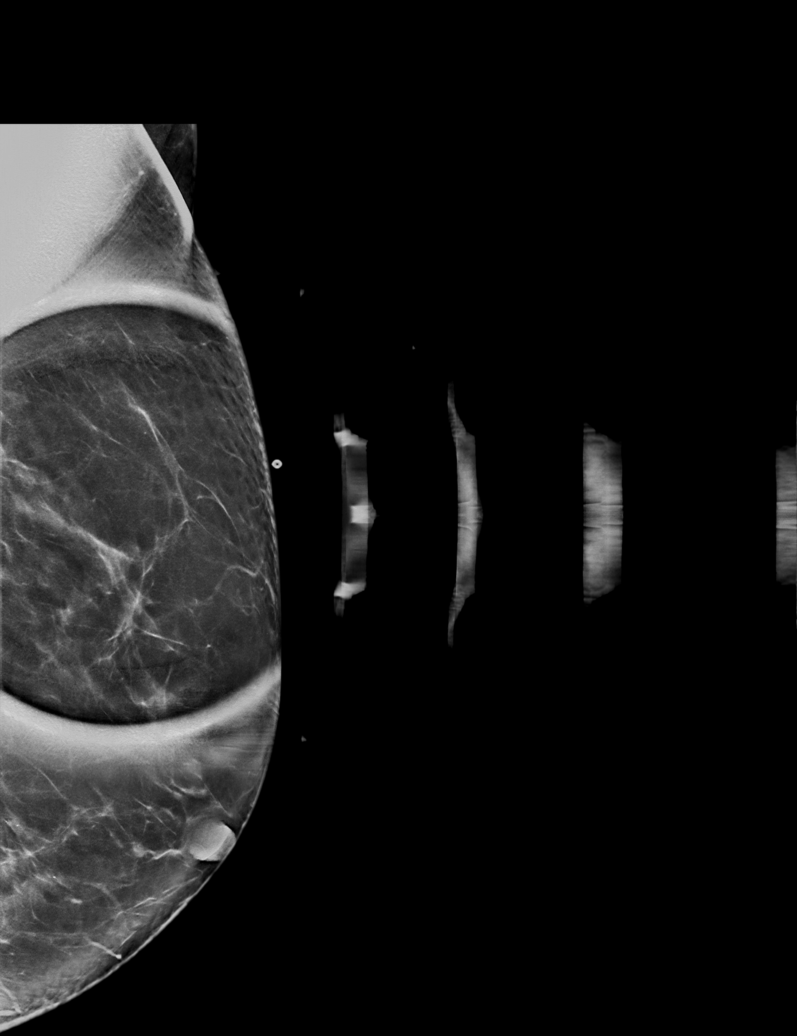

[R CC synth-2D]
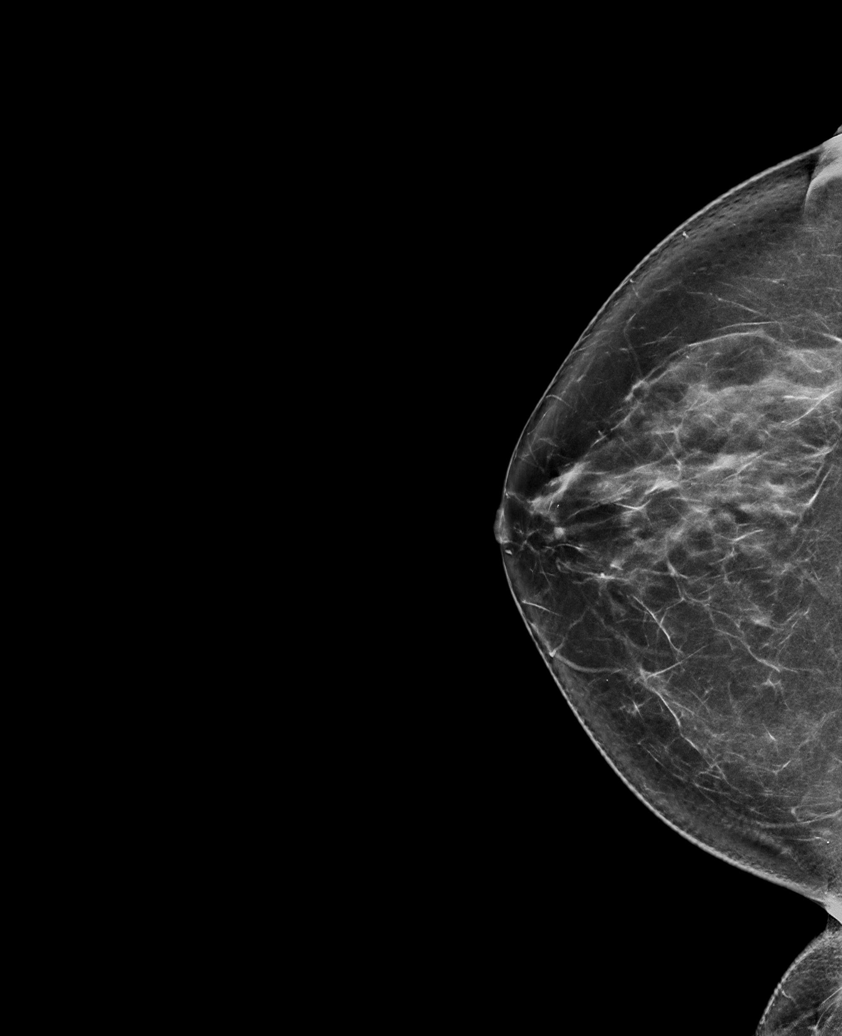

[L MLO tomo · tomo slice 45/89.0]
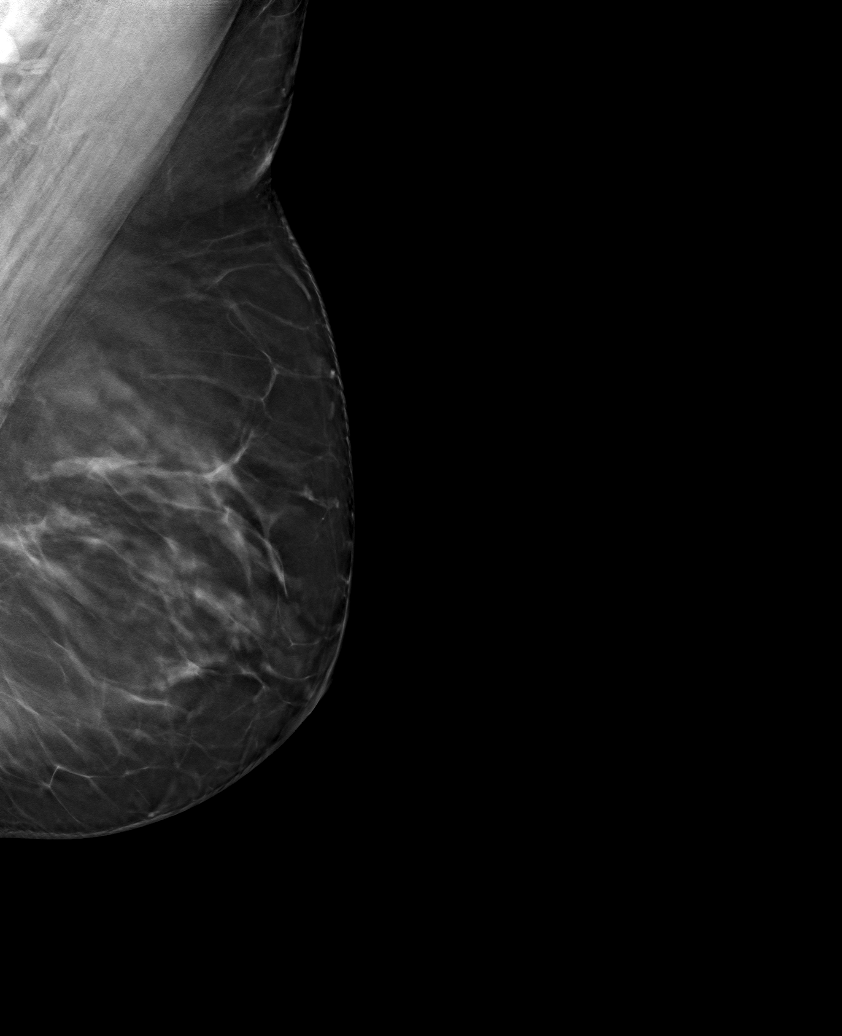

[6 of 30 positions shown; findings below may reference images not displayed]

ACR Breast Density Category c: The breast tissue is heterogeneously
dense, which may obscure small masses.
FINDINGS: No suspicious masses, calcifications, or distortion are identified
mammographically.

Mammographic images were processed with CAD.

On physical exam, no suspicious lumps are identified.

Targeted ultrasound is performed, showing no sonographic correlate
for the patient's palpable lump.
IMPRESSION: No mammographic or sonographic evidence of malignancy.

RECOMMENDATION:
Treatment of the patient's palpable lump should be based on clinical
and physical exam given lack of imaging findings. Recommend annual
screening mammography.

I have discussed the findings and recommendations with the patient.
If applicable, a reminder letter will be sent to the patient
regarding the next appointment.

BI-RADS CATEGORY  1: Negative.

## 2021-03-18 DIAGNOSIS — Z8632 Personal history of gestational diabetes: Secondary | ICD-10-CM | POA: Diagnosis not present

## 2021-03-18 DIAGNOSIS — K760 Fatty (change of) liver, not elsewhere classified: Secondary | ICD-10-CM | POA: Diagnosis not present

## 2021-03-18 DIAGNOSIS — G43111 Migraine with aura, intractable, with status migrainosus: Secondary | ICD-10-CM | POA: Diagnosis not present

## 2021-03-18 DIAGNOSIS — Z6828 Body mass index (BMI) 28.0-28.9, adult: Secondary | ICD-10-CM | POA: Diagnosis not present

## 2021-03-23 DIAGNOSIS — G243 Spasmodic torticollis: Secondary | ICD-10-CM | POA: Diagnosis not present

## 2021-03-23 DIAGNOSIS — G43719 Chronic migraine without aura, intractable, without status migrainosus: Secondary | ICD-10-CM | POA: Diagnosis not present

## 2021-03-29 DIAGNOSIS — G43719 Chronic migraine without aura, intractable, without status migrainosus: Secondary | ICD-10-CM | POA: Diagnosis not present

## 2021-03-29 DIAGNOSIS — M62838 Other muscle spasm: Secondary | ICD-10-CM | POA: Diagnosis not present

## 2021-03-29 DIAGNOSIS — G243 Spasmodic torticollis: Secondary | ICD-10-CM | POA: Diagnosis not present

## 2021-03-29 DIAGNOSIS — R52 Pain, unspecified: Secondary | ICD-10-CM | POA: Diagnosis not present

## 2021-03-29 DIAGNOSIS — G43911 Migraine, unspecified, intractable, with status migrainosus: Secondary | ICD-10-CM | POA: Diagnosis not present

## 2021-05-18 DIAGNOSIS — Z1152 Encounter for screening for COVID-19: Secondary | ICD-10-CM | POA: Diagnosis not present

## 2021-05-18 DIAGNOSIS — J309 Allergic rhinitis, unspecified: Secondary | ICD-10-CM | POA: Diagnosis not present

## 2021-05-18 DIAGNOSIS — R059 Cough, unspecified: Secondary | ICD-10-CM | POA: Diagnosis not present

## 2021-05-25 DIAGNOSIS — M5412 Radiculopathy, cervical region: Secondary | ICD-10-CM | POA: Diagnosis not present

## 2021-05-25 DIAGNOSIS — Z981 Arthrodesis status: Secondary | ICD-10-CM | POA: Diagnosis not present

## 2021-05-28 DIAGNOSIS — G43719 Chronic migraine without aura, intractable, without status migrainosus: Secondary | ICD-10-CM | POA: Diagnosis not present

## 2021-05-28 DIAGNOSIS — G243 Spasmodic torticollis: Secondary | ICD-10-CM | POA: Diagnosis not present

## 2021-06-02 DIAGNOSIS — S93402A Sprain of unspecified ligament of left ankle, initial encounter: Secondary | ICD-10-CM | POA: Diagnosis not present

## 2021-06-21 DIAGNOSIS — M62838 Other muscle spasm: Secondary | ICD-10-CM | POA: Diagnosis not present

## 2021-06-21 DIAGNOSIS — R52 Pain, unspecified: Secondary | ICD-10-CM | POA: Diagnosis not present

## 2021-06-21 DIAGNOSIS — G243 Spasmodic torticollis: Secondary | ICD-10-CM | POA: Diagnosis not present

## 2021-06-21 DIAGNOSIS — G43911 Migraine, unspecified, intractable, with status migrainosus: Secondary | ICD-10-CM | POA: Diagnosis not present

## 2021-09-17 DIAGNOSIS — G43719 Chronic migraine without aura, intractable, without status migrainosus: Secondary | ICD-10-CM | POA: Diagnosis not present

## 2021-09-17 DIAGNOSIS — R52 Pain, unspecified: Secondary | ICD-10-CM | POA: Diagnosis not present

## 2021-09-17 DIAGNOSIS — G243 Spasmodic torticollis: Secondary | ICD-10-CM | POA: Diagnosis not present

## 2021-09-17 DIAGNOSIS — M5481 Occipital neuralgia: Secondary | ICD-10-CM | POA: Diagnosis not present

## 2021-10-17 DIAGNOSIS — J209 Acute bronchitis, unspecified: Secondary | ICD-10-CM | POA: Diagnosis not present

## 2021-10-25 DIAGNOSIS — J209 Acute bronchitis, unspecified: Secondary | ICD-10-CM | POA: Diagnosis not present

## 2021-10-25 DIAGNOSIS — Z6828 Body mass index (BMI) 28.0-28.9, adult: Secondary | ICD-10-CM | POA: Diagnosis not present

## 2021-12-08 DIAGNOSIS — G243 Spasmodic torticollis: Secondary | ICD-10-CM | POA: Diagnosis not present

## 2021-12-08 DIAGNOSIS — G43719 Chronic migraine without aura, intractable, without status migrainosus: Secondary | ICD-10-CM | POA: Diagnosis not present

## 2021-12-09 DIAGNOSIS — M62838 Other muscle spasm: Secondary | ICD-10-CM | POA: Diagnosis not present

## 2021-12-09 DIAGNOSIS — G43911 Migraine, unspecified, intractable, with status migrainosus: Secondary | ICD-10-CM | POA: Diagnosis not present

## 2021-12-09 DIAGNOSIS — G243 Spasmodic torticollis: Secondary | ICD-10-CM | POA: Diagnosis not present

## 2021-12-09 DIAGNOSIS — R52 Pain, unspecified: Secondary | ICD-10-CM | POA: Diagnosis not present

## 2022-01-05 DIAGNOSIS — R52 Pain, unspecified: Secondary | ICD-10-CM | POA: Diagnosis not present

## 2022-01-05 DIAGNOSIS — G43719 Chronic migraine without aura, intractable, without status migrainosus: Secondary | ICD-10-CM | POA: Diagnosis not present

## 2022-01-05 DIAGNOSIS — G43911 Migraine, unspecified, intractable, with status migrainosus: Secondary | ICD-10-CM | POA: Diagnosis not present

## 2022-02-16 DIAGNOSIS — G243 Spasmodic torticollis: Secondary | ICD-10-CM | POA: Diagnosis not present

## 2022-02-16 DIAGNOSIS — G43719 Chronic migraine without aura, intractable, without status migrainosus: Secondary | ICD-10-CM | POA: Diagnosis not present

## 2022-03-03 DIAGNOSIS — M62838 Other muscle spasm: Secondary | ICD-10-CM | POA: Diagnosis not present

## 2022-03-03 DIAGNOSIS — G243 Spasmodic torticollis: Secondary | ICD-10-CM | POA: Diagnosis not present

## 2022-03-03 DIAGNOSIS — R52 Pain, unspecified: Secondary | ICD-10-CM | POA: Diagnosis not present

## 2022-03-21 DIAGNOSIS — G243 Spasmodic torticollis: Secondary | ICD-10-CM | POA: Diagnosis not present

## 2022-03-21 DIAGNOSIS — G43719 Chronic migraine without aura, intractable, without status migrainosus: Secondary | ICD-10-CM | POA: Diagnosis not present

## 2022-05-16 DIAGNOSIS — G243 Spasmodic torticollis: Secondary | ICD-10-CM | POA: Diagnosis not present

## 2022-05-16 DIAGNOSIS — G43719 Chronic migraine without aura, intractable, without status migrainosus: Secondary | ICD-10-CM | POA: Diagnosis not present

## 2022-05-26 DIAGNOSIS — G243 Spasmodic torticollis: Secondary | ICD-10-CM | POA: Diagnosis not present

## 2022-05-26 DIAGNOSIS — R52 Pain, unspecified: Secondary | ICD-10-CM | POA: Diagnosis not present

## 2022-05-26 DIAGNOSIS — G43911 Migraine, unspecified, intractable, with status migrainosus: Secondary | ICD-10-CM | POA: Diagnosis not present

## 2022-05-26 DIAGNOSIS — M62838 Other muscle spasm: Secondary | ICD-10-CM | POA: Diagnosis not present

## 2022-07-19 DIAGNOSIS — G43719 Chronic migraine without aura, intractable, without status migrainosus: Secondary | ICD-10-CM | POA: Diagnosis not present

## 2022-07-19 DIAGNOSIS — M62838 Other muscle spasm: Secondary | ICD-10-CM | POA: Diagnosis not present

## 2022-07-19 DIAGNOSIS — R52 Pain, unspecified: Secondary | ICD-10-CM | POA: Diagnosis not present

## 2022-07-19 DIAGNOSIS — G243 Spasmodic torticollis: Secondary | ICD-10-CM | POA: Diagnosis not present

## 2022-07-19 DIAGNOSIS — G43911 Migraine, unspecified, intractable, with status migrainosus: Secondary | ICD-10-CM | POA: Diagnosis not present

## 2022-08-18 DIAGNOSIS — G243 Spasmodic torticollis: Secondary | ICD-10-CM | POA: Diagnosis not present

## 2022-08-18 DIAGNOSIS — M62838 Other muscle spasm: Secondary | ICD-10-CM | POA: Diagnosis not present

## 2022-08-18 DIAGNOSIS — G43911 Migraine, unspecified, intractable, with status migrainosus: Secondary | ICD-10-CM | POA: Diagnosis not present

## 2022-08-18 DIAGNOSIS — R52 Pain, unspecified: Secondary | ICD-10-CM | POA: Diagnosis not present

## 2022-09-20 DIAGNOSIS — F419 Anxiety disorder, unspecified: Secondary | ICD-10-CM | POA: Diagnosis not present

## 2022-09-20 DIAGNOSIS — B029 Zoster without complications: Secondary | ICD-10-CM | POA: Diagnosis not present

## 2022-09-28 DIAGNOSIS — Z1331 Encounter for screening for depression: Secondary | ICD-10-CM | POA: Diagnosis not present

## 2022-09-28 DIAGNOSIS — Z13220 Encounter for screening for lipoid disorders: Secondary | ICD-10-CM | POA: Diagnosis not present

## 2022-09-28 DIAGNOSIS — Z0000 Encounter for general adult medical examination without abnormal findings: Secondary | ICD-10-CM | POA: Diagnosis not present

## 2022-09-28 DIAGNOSIS — Z131 Encounter for screening for diabetes mellitus: Secondary | ICD-10-CM | POA: Diagnosis not present

## 2022-09-28 DIAGNOSIS — B3731 Acute candidiasis of vulva and vagina: Secondary | ICD-10-CM | POA: Diagnosis not present

## 2022-09-28 DIAGNOSIS — E559 Vitamin D deficiency, unspecified: Secondary | ICD-10-CM | POA: Diagnosis not present

## 2022-09-28 DIAGNOSIS — K760 Fatty (change of) liver, not elsewhere classified: Secondary | ICD-10-CM | POA: Diagnosis not present

## 2022-09-29 ENCOUNTER — Other Ambulatory Visit: Payer: Self-pay | Admitting: Family Medicine

## 2022-09-29 DIAGNOSIS — Z1231 Encounter for screening mammogram for malignant neoplasm of breast: Secondary | ICD-10-CM

## 2022-10-13 DIAGNOSIS — G43719 Chronic migraine without aura, intractable, without status migrainosus: Secondary | ICD-10-CM | POA: Diagnosis not present

## 2022-11-01 DIAGNOSIS — G243 Spasmodic torticollis: Secondary | ICD-10-CM | POA: Diagnosis not present

## 2022-11-15 DIAGNOSIS — G243 Spasmodic torticollis: Secondary | ICD-10-CM | POA: Diagnosis not present

## 2022-11-15 DIAGNOSIS — G43719 Chronic migraine without aura, intractable, without status migrainosus: Secondary | ICD-10-CM | POA: Diagnosis not present

## 2022-11-15 DIAGNOSIS — R52 Pain, unspecified: Secondary | ICD-10-CM | POA: Diagnosis not present

## 2022-11-15 DIAGNOSIS — M62838 Other muscle spasm: Secondary | ICD-10-CM | POA: Diagnosis not present

## 2022-11-16 ENCOUNTER — Ambulatory Visit: Payer: BC Managed Care – PPO

## 2022-12-15 ENCOUNTER — Ambulatory Visit
Admission: RE | Admit: 2022-12-15 | Discharge: 2022-12-15 | Disposition: A | Discharge status: Home / Self Care | Payer: BC Managed Care – PPO | Source: Ambulatory Visit | Attending: Family Medicine | Admitting: Family Medicine

## 2022-12-15 DIAGNOSIS — Z1231 Encounter for screening mammogram for malignant neoplasm of breast: Secondary | ICD-10-CM | POA: Diagnosis not present

## 2022-12-23 DIAGNOSIS — E1165 Type 2 diabetes mellitus with hyperglycemia: Secondary | ICD-10-CM | POA: Diagnosis not present

## 2022-12-23 DIAGNOSIS — Z7984 Long term (current) use of oral hypoglycemic drugs: Secondary | ICD-10-CM | POA: Diagnosis not present

## 2022-12-23 DIAGNOSIS — E119 Type 2 diabetes mellitus without complications: Secondary | ICD-10-CM | POA: Diagnosis not present

## 2022-12-23 DIAGNOSIS — E559 Vitamin D deficiency, unspecified: Secondary | ICD-10-CM | POA: Diagnosis not present

## 2022-12-23 DIAGNOSIS — K760 Fatty (change of) liver, not elsewhere classified: Secondary | ICD-10-CM | POA: Diagnosis not present

## 2022-12-23 DIAGNOSIS — H5203 Hypermetropia, bilateral: Secondary | ICD-10-CM | POA: Diagnosis not present

## 2022-12-23 DIAGNOSIS — Z6830 Body mass index (BMI) 30.0-30.9, adult: Secondary | ICD-10-CM | POA: Diagnosis not present

## 2023-01-23 DIAGNOSIS — G243 Spasmodic torticollis: Secondary | ICD-10-CM | POA: Diagnosis not present

## 2023-02-09 DIAGNOSIS — G243 Spasmodic torticollis: Secondary | ICD-10-CM | POA: Diagnosis not present

## 2023-02-09 DIAGNOSIS — R52 Pain, unspecified: Secondary | ICD-10-CM | POA: Diagnosis not present

## 2023-02-09 DIAGNOSIS — G43911 Migraine, unspecified, intractable, with status migrainosus: Secondary | ICD-10-CM | POA: Diagnosis not present

## 2023-02-09 DIAGNOSIS — M62838 Other muscle spasm: Secondary | ICD-10-CM | POA: Diagnosis not present

## 2023-03-31 DIAGNOSIS — E559 Vitamin D deficiency, unspecified: Secondary | ICD-10-CM | POA: Diagnosis not present

## 2023-03-31 DIAGNOSIS — E1165 Type 2 diabetes mellitus with hyperglycemia: Secondary | ICD-10-CM | POA: Diagnosis not present

## 2023-03-31 DIAGNOSIS — K760 Fatty (change of) liver, not elsewhere classified: Secondary | ICD-10-CM | POA: Diagnosis not present

## 2023-04-04 DIAGNOSIS — G43719 Chronic migraine without aura, intractable, without status migrainosus: Secondary | ICD-10-CM | POA: Diagnosis not present

## 2023-04-11 DIAGNOSIS — G243 Spasmodic torticollis: Secondary | ICD-10-CM | POA: Diagnosis not present

## 2023-05-05 DIAGNOSIS — G43911 Migraine, unspecified, intractable, with status migrainosus: Secondary | ICD-10-CM | POA: Diagnosis not present

## 2023-05-05 DIAGNOSIS — R52 Pain, unspecified: Secondary | ICD-10-CM | POA: Diagnosis not present

## 2023-05-05 DIAGNOSIS — G243 Spasmodic torticollis: Secondary | ICD-10-CM | POA: Diagnosis not present

## 2023-05-05 DIAGNOSIS — M62838 Other muscle spasm: Secondary | ICD-10-CM | POA: Diagnosis not present

## 2023-06-21 DIAGNOSIS — F419 Anxiety disorder, unspecified: Secondary | ICD-10-CM | POA: Diagnosis not present

## 2023-06-21 DIAGNOSIS — A881 Epidemic vertigo: Secondary | ICD-10-CM | POA: Diagnosis not present

## 2023-06-21 DIAGNOSIS — R6884 Jaw pain: Secondary | ICD-10-CM | POA: Diagnosis not present

## 2023-06-21 DIAGNOSIS — K047 Periapical abscess without sinus: Secondary | ICD-10-CM | POA: Diagnosis not present

## 2023-07-07 DIAGNOSIS — G43911 Migraine, unspecified, intractable, with status migrainosus: Secondary | ICD-10-CM | POA: Diagnosis not present

## 2023-07-07 DIAGNOSIS — M62838 Other muscle spasm: Secondary | ICD-10-CM | POA: Diagnosis not present

## 2023-07-11 DIAGNOSIS — G43719 Chronic migraine without aura, intractable, without status migrainosus: Secondary | ICD-10-CM | POA: Diagnosis not present

## 2023-07-21 DIAGNOSIS — M25561 Pain in right knee: Secondary | ICD-10-CM | POA: Diagnosis not present

## 2023-07-25 DIAGNOSIS — G243 Spasmodic torticollis: Secondary | ICD-10-CM | POA: Diagnosis not present

## 2023-07-28 DIAGNOSIS — G43911 Migraine, unspecified, intractable, with status migrainosus: Secondary | ICD-10-CM | POA: Diagnosis not present

## 2023-07-28 DIAGNOSIS — G243 Spasmodic torticollis: Secondary | ICD-10-CM | POA: Diagnosis not present

## 2023-07-28 DIAGNOSIS — M7912 Myalgia of auxiliary muscles, head and neck: Secondary | ICD-10-CM | POA: Diagnosis not present

## 2023-08-04 DIAGNOSIS — D496 Neoplasm of unspecified behavior of brain: Secondary | ICD-10-CM | POA: Diagnosis not present

## 2023-08-04 DIAGNOSIS — M4802 Spinal stenosis, cervical region: Secondary | ICD-10-CM | POA: Diagnosis not present

## 2023-08-13 DIAGNOSIS — F1210 Cannabis abuse, uncomplicated: Secondary | ICD-10-CM | POA: Diagnosis not present

## 2023-08-13 DIAGNOSIS — Z79899 Other long term (current) drug therapy: Secondary | ICD-10-CM | POA: Diagnosis not present

## 2023-08-13 DIAGNOSIS — R079 Chest pain, unspecified: Secondary | ICD-10-CM | POA: Diagnosis not present

## 2023-08-13 DIAGNOSIS — Z8669 Personal history of other diseases of the nervous system and sense organs: Secondary | ICD-10-CM | POA: Diagnosis not present

## 2023-08-13 DIAGNOSIS — E119 Type 2 diabetes mellitus without complications: Secondary | ICD-10-CM | POA: Diagnosis not present

## 2023-08-30 DIAGNOSIS — M25561 Pain in right knee: Secondary | ICD-10-CM | POA: Diagnosis not present

## 2023-08-30 DIAGNOSIS — E559 Vitamin D deficiency, unspecified: Secondary | ICD-10-CM | POA: Diagnosis not present

## 2023-08-30 DIAGNOSIS — K760 Fatty (change of) liver, not elsewhere classified: Secondary | ICD-10-CM | POA: Diagnosis not present

## 2023-08-30 DIAGNOSIS — E1165 Type 2 diabetes mellitus with hyperglycemia: Secondary | ICD-10-CM | POA: Diagnosis not present

## 2023-09-06 DIAGNOSIS — R52 Pain, unspecified: Secondary | ICD-10-CM | POA: Diagnosis not present

## 2023-09-06 DIAGNOSIS — M531 Cervicobrachial syndrome: Secondary | ICD-10-CM | POA: Diagnosis not present

## 2023-09-06 DIAGNOSIS — G43911 Migraine, unspecified, intractable, with status migrainosus: Secondary | ICD-10-CM | POA: Diagnosis not present

## 2023-09-19 DIAGNOSIS — R519 Headache, unspecified: Secondary | ICD-10-CM | POA: Diagnosis not present

## 2023-09-19 DIAGNOSIS — G609 Hereditary and idiopathic neuropathy, unspecified: Secondary | ICD-10-CM | POA: Diagnosis not present

## 2023-09-19 NOTE — Progress Notes (Signed)
 Okay to discharge per Dr. Velinda Bigger per PA Chyrl. Patient given AVS. Patient and family verbalized understanding. Patient and family stated that they have no further questions at this time and will contact the numbers provided if any issues arise. PIV removed with tip intact. Patient states pain is 2/10 in her head which is chronic.  Patient states feeling comfortable leaving. VSS per patient baseline, awake, alert and oriented x4 upon discharge. Patient discharged home with driver.

## 2023-09-25 DIAGNOSIS — S93401A Sprain of unspecified ligament of right ankle, initial encounter: Secondary | ICD-10-CM | POA: Diagnosis not present

## 2023-09-28 DIAGNOSIS — S83411A Sprain of medial collateral ligament of right knee, initial encounter: Secondary | ICD-10-CM | POA: Diagnosis not present

## 2023-09-28 DIAGNOSIS — S93401A Sprain of unspecified ligament of right ankle, initial encounter: Secondary | ICD-10-CM | POA: Diagnosis not present

## 2023-10-12 DIAGNOSIS — S93401A Sprain of unspecified ligament of right ankle, initial encounter: Secondary | ICD-10-CM | POA: Diagnosis not present

## 2023-10-12 DIAGNOSIS — G243 Spasmodic torticollis: Secondary | ICD-10-CM | POA: Diagnosis not present

## 2023-10-12 DIAGNOSIS — S83411A Sprain of medial collateral ligament of right knee, initial encounter: Secondary | ICD-10-CM | POA: Diagnosis not present

## 2023-10-19 DIAGNOSIS — M7912 Myalgia of auxiliary muscles, head and neck: Secondary | ICD-10-CM | POA: Diagnosis not present

## 2023-10-19 DIAGNOSIS — G43911 Migraine, unspecified, intractable, with status migrainosus: Secondary | ICD-10-CM | POA: Diagnosis not present

## 2023-10-19 DIAGNOSIS — R52 Pain, unspecified: Secondary | ICD-10-CM | POA: Diagnosis not present

## 2023-10-19 DIAGNOSIS — G243 Spasmodic torticollis: Secondary | ICD-10-CM | POA: Diagnosis not present

## 2023-10-23 DIAGNOSIS — M961 Postlaminectomy syndrome, not elsewhere classified: Secondary | ICD-10-CM | POA: Diagnosis not present

## 2023-10-23 DIAGNOSIS — G43719 Chronic migraine without aura, intractable, without status migrainosus: Secondary | ICD-10-CM | POA: Diagnosis not present

## 2023-10-23 DIAGNOSIS — G4489 Other headache syndrome: Secondary | ICD-10-CM | POA: Diagnosis not present

## 2023-10-23 DIAGNOSIS — M5412 Radiculopathy, cervical region: Secondary | ICD-10-CM | POA: Diagnosis not present

## 2023-10-23 DIAGNOSIS — Z1331 Encounter for screening for depression: Secondary | ICD-10-CM | POA: Diagnosis not present

## 2023-10-29 DIAGNOSIS — E042 Nontoxic multinodular goiter: Secondary | ICD-10-CM | POA: Diagnosis not present

## 2023-10-29 DIAGNOSIS — E041 Nontoxic single thyroid nodule: Secondary | ICD-10-CM | POA: Diagnosis not present

## 2023-11-14 DIAGNOSIS — M5412 Radiculopathy, cervical region: Secondary | ICD-10-CM | POA: Diagnosis not present

## 2023-11-27 DIAGNOSIS — E041 Nontoxic single thyroid nodule: Secondary | ICD-10-CM | POA: Diagnosis not present

## 2023-12-18 DIAGNOSIS — R1110 Vomiting, unspecified: Secondary | ICD-10-CM | POA: Diagnosis not present

## 2023-12-18 DIAGNOSIS — R197 Diarrhea, unspecified: Secondary | ICD-10-CM | POA: Diagnosis not present

## 2023-12-18 DIAGNOSIS — R6889 Other general symptoms and signs: Secondary | ICD-10-CM | POA: Diagnosis not present

## 2024-01-01 DIAGNOSIS — M47812 Spondylosis without myelopathy or radiculopathy, cervical region: Secondary | ICD-10-CM | POA: Diagnosis not present

## 2024-01-01 DIAGNOSIS — G243 Spasmodic torticollis: Secondary | ICD-10-CM | POA: Diagnosis not present

## 2024-01-01 DIAGNOSIS — G43719 Chronic migraine without aura, intractable, without status migrainosus: Secondary | ICD-10-CM | POA: Diagnosis not present

## 2024-01-01 DIAGNOSIS — M961 Postlaminectomy syndrome, not elsewhere classified: Secondary | ICD-10-CM | POA: Diagnosis not present

## 2024-01-01 DIAGNOSIS — G4489 Other headache syndrome: Secondary | ICD-10-CM | POA: Diagnosis not present

## 2024-01-04 DIAGNOSIS — G43719 Chronic migraine without aura, intractable, without status migrainosus: Secondary | ICD-10-CM | POA: Diagnosis not present

## 2024-01-10 DIAGNOSIS — E559 Vitamin D deficiency, unspecified: Secondary | ICD-10-CM | POA: Diagnosis not present

## 2024-01-10 DIAGNOSIS — E041 Nontoxic single thyroid nodule: Secondary | ICD-10-CM | POA: Diagnosis not present

## 2024-01-10 DIAGNOSIS — K760 Fatty (change of) liver, not elsewhere classified: Secondary | ICD-10-CM | POA: Diagnosis not present

## 2024-01-10 DIAGNOSIS — Z1331 Encounter for screening for depression: Secondary | ICD-10-CM | POA: Diagnosis not present

## 2024-01-10 DIAGNOSIS — G43909 Migraine, unspecified, not intractable, without status migrainosus: Secondary | ICD-10-CM | POA: Diagnosis not present

## 2024-01-10 DIAGNOSIS — E1165 Type 2 diabetes mellitus with hyperglycemia: Secondary | ICD-10-CM | POA: Diagnosis not present

## 2024-01-11 DIAGNOSIS — G243 Spasmodic torticollis: Secondary | ICD-10-CM | POA: Diagnosis not present

## 2024-01-11 DIAGNOSIS — G43911 Migraine, unspecified, intractable, with status migrainosus: Secondary | ICD-10-CM | POA: Diagnosis not present

## 2024-01-11 DIAGNOSIS — M7912 Myalgia of auxiliary muscles, head and neck: Secondary | ICD-10-CM | POA: Diagnosis not present

## 2024-01-15 DIAGNOSIS — R293 Abnormal posture: Secondary | ICD-10-CM | POA: Diagnosis not present

## 2024-01-15 DIAGNOSIS — M6281 Muscle weakness (generalized): Secondary | ICD-10-CM | POA: Diagnosis not present

## 2024-01-15 DIAGNOSIS — M542 Cervicalgia: Secondary | ICD-10-CM | POA: Diagnosis not present

## 2024-01-16 DIAGNOSIS — M47812 Spondylosis without myelopathy or radiculopathy, cervical region: Secondary | ICD-10-CM | POA: Diagnosis not present

## 2024-01-19 DIAGNOSIS — R293 Abnormal posture: Secondary | ICD-10-CM | POA: Diagnosis not present

## 2024-01-19 DIAGNOSIS — M6281 Muscle weakness (generalized): Secondary | ICD-10-CM | POA: Diagnosis not present

## 2024-01-19 DIAGNOSIS — M542 Cervicalgia: Secondary | ICD-10-CM | POA: Diagnosis not present

## 2024-01-30 DIAGNOSIS — M47812 Spondylosis without myelopathy or radiculopathy, cervical region: Secondary | ICD-10-CM | POA: Diagnosis not present

## 2024-02-05 DIAGNOSIS — R52 Pain, unspecified: Secondary | ICD-10-CM | POA: Diagnosis not present

## 2024-02-05 DIAGNOSIS — G43911 Migraine, unspecified, intractable, with status migrainosus: Secondary | ICD-10-CM | POA: Diagnosis not present

## 2024-02-05 DIAGNOSIS — M531 Cervicobrachial syndrome: Secondary | ICD-10-CM | POA: Diagnosis not present

## 2024-02-15 DIAGNOSIS — K529 Noninfective gastroenteritis and colitis, unspecified: Secondary | ICD-10-CM | POA: Diagnosis not present

## 2024-02-19 DIAGNOSIS — R197 Diarrhea, unspecified: Secondary | ICD-10-CM | POA: Diagnosis not present

## 2024-02-19 DIAGNOSIS — E860 Dehydration: Secondary | ICD-10-CM | POA: Diagnosis not present

## 2024-02-19 DIAGNOSIS — Z8673 Personal history of transient ischemic attack (TIA), and cerebral infarction without residual deficits: Secondary | ICD-10-CM | POA: Diagnosis not present

## 2024-02-19 DIAGNOSIS — R1110 Vomiting, unspecified: Secondary | ICD-10-CM | POA: Diagnosis not present

## 2024-02-19 DIAGNOSIS — F1290 Cannabis use, unspecified, uncomplicated: Secondary | ICD-10-CM | POA: Diagnosis not present

## 2024-02-19 DIAGNOSIS — Z79899 Other long term (current) drug therapy: Secondary | ICD-10-CM | POA: Diagnosis not present

## 2024-02-19 DIAGNOSIS — R112 Nausea with vomiting, unspecified: Secondary | ICD-10-CM | POA: Diagnosis not present

## 2024-02-19 DIAGNOSIS — E119 Type 2 diabetes mellitus without complications: Secondary | ICD-10-CM | POA: Diagnosis not present

## 2024-02-19 DIAGNOSIS — R1030 Lower abdominal pain, unspecified: Secondary | ICD-10-CM | POA: Diagnosis not present

## 2024-02-26 DIAGNOSIS — R1110 Vomiting, unspecified: Secondary | ICD-10-CM | POA: Diagnosis not present

## 2024-02-26 DIAGNOSIS — R1011 Right upper quadrant pain: Secondary | ICD-10-CM | POA: Diagnosis not present

## 2024-02-26 DIAGNOSIS — R197 Diarrhea, unspecified: Secondary | ICD-10-CM | POA: Diagnosis not present

## 2024-03-01 DIAGNOSIS — R160 Hepatomegaly, not elsewhere classified: Secondary | ICD-10-CM | POA: Diagnosis not present

## 2024-03-01 DIAGNOSIS — R1011 Right upper quadrant pain: Secondary | ICD-10-CM | POA: Diagnosis not present

## 2024-03-01 DIAGNOSIS — R932 Abnormal findings on diagnostic imaging of liver and biliary tract: Secondary | ICD-10-CM | POA: Diagnosis not present

## 2024-03-01 DIAGNOSIS — K760 Fatty (change of) liver, not elsewhere classified: Secondary | ICD-10-CM | POA: Diagnosis not present

## 2024-03-01 DIAGNOSIS — K828 Other specified diseases of gallbladder: Secondary | ICD-10-CM | POA: Diagnosis not present

## 2024-03-05 DIAGNOSIS — M47812 Spondylosis without myelopathy or radiculopathy, cervical region: Secondary | ICD-10-CM | POA: Diagnosis not present

## 2024-03-14 ENCOUNTER — Other Ambulatory Visit (HOSPITAL_COMMUNITY): Payer: Self-pay | Admitting: Gastroenterology

## 2024-03-14 DIAGNOSIS — R112 Nausea with vomiting, unspecified: Secondary | ICD-10-CM | POA: Diagnosis not present

## 2024-03-14 DIAGNOSIS — R748 Abnormal levels of other serum enzymes: Secondary | ICD-10-CM | POA: Diagnosis not present

## 2024-03-14 DIAGNOSIS — K760 Fatty (change of) liver, not elsewhere classified: Secondary | ICD-10-CM | POA: Diagnosis not present

## 2024-03-14 DIAGNOSIS — Z1211 Encounter for screening for malignant neoplasm of colon: Secondary | ICD-10-CM | POA: Diagnosis not present

## 2024-03-14 DIAGNOSIS — Z800 Family history of malignant neoplasm of digestive organs: Secondary | ICD-10-CM | POA: Diagnosis not present

## 2024-03-14 DIAGNOSIS — K219 Gastro-esophageal reflux disease without esophagitis: Secondary | ICD-10-CM | POA: Diagnosis not present

## 2024-03-15 ENCOUNTER — Encounter (HOSPITAL_COMMUNITY): Payer: Self-pay

## 2024-03-15 ENCOUNTER — Encounter (HOSPITAL_COMMUNITY)
Admission: RE | Admit: 2024-03-15 | Discharge: 2024-03-15 | Disposition: A | Discharge status: Home / Self Care | Source: Ambulatory Visit | Attending: Gastroenterology | Admitting: Gastroenterology

## 2024-03-15 DIAGNOSIS — R112 Nausea with vomiting, unspecified: Secondary | ICD-10-CM | POA: Insufficient documentation

## 2024-03-15 DIAGNOSIS — R1011 Right upper quadrant pain: Secondary | ICD-10-CM | POA: Diagnosis not present

## 2024-03-15 DIAGNOSIS — K820 Obstruction of gallbladder: Secondary | ICD-10-CM | POA: Diagnosis not present

## 2024-03-15 MED ORDER — TECHNETIUM TC 99M MEBROFENIN IV KIT
5.0000 | PACK | Freq: Once | INTRAVENOUS | Status: AC | PRN
  Administered 2024-03-15: 5.25 via INTRAVENOUS

## 2024-03-18 ENCOUNTER — Other Ambulatory Visit: Payer: Self-pay | Admitting: Gastroenterology

## 2024-03-18 DIAGNOSIS — R7989 Other specified abnormal findings of blood chemistry: Secondary | ICD-10-CM

## 2024-03-19 ENCOUNTER — Other Ambulatory Visit (HOSPITAL_BASED_OUTPATIENT_CLINIC_OR_DEPARTMENT_OTHER): Payer: Self-pay

## 2024-03-20 DIAGNOSIS — R197 Diarrhea, unspecified: Secondary | ICD-10-CM | POA: Diagnosis not present

## 2024-03-20 DIAGNOSIS — E781 Pure hyperglyceridemia: Secondary | ICD-10-CM | POA: Diagnosis not present

## 2024-03-20 DIAGNOSIS — R112 Nausea with vomiting, unspecified: Secondary | ICD-10-CM | POA: Diagnosis not present

## 2024-03-20 DIAGNOSIS — F419 Anxiety disorder, unspecified: Secondary | ICD-10-CM | POA: Diagnosis not present

## 2024-03-20 DIAGNOSIS — R7401 Elevation of levels of liver transaminase levels: Secondary | ICD-10-CM | POA: Diagnosis not present

## 2024-03-20 DIAGNOSIS — K859 Acute pancreatitis without necrosis or infection, unspecified: Secondary | ICD-10-CM | POA: Diagnosis not present

## 2024-03-20 DIAGNOSIS — T50905A Adverse effect of unspecified drugs, medicaments and biological substances, initial encounter: Secondary | ICD-10-CM | POA: Diagnosis not present

## 2024-03-20 DIAGNOSIS — Z79899 Other long term (current) drug therapy: Secondary | ICD-10-CM | POA: Diagnosis not present

## 2024-03-20 DIAGNOSIS — R1011 Right upper quadrant pain: Secondary | ICD-10-CM | POA: Diagnosis not present

## 2024-03-26 ENCOUNTER — Inpatient Hospital Stay
Admission: RE | Admit: 2024-03-26 | Discharge: 2024-03-26 | Discharge status: Home / Self Care | Source: Ambulatory Visit | Attending: Gastroenterology | Admitting: Gastroenterology

## 2024-03-26 ENCOUNTER — Other Ambulatory Visit

## 2024-03-26 DIAGNOSIS — R7989 Other specified abnormal findings of blood chemistry: Secondary | ICD-10-CM

## 2024-03-26 DIAGNOSIS — K760 Fatty (change of) liver, not elsewhere classified: Secondary | ICD-10-CM | POA: Diagnosis not present

## 2024-03-28 DIAGNOSIS — R197 Diarrhea, unspecified: Secondary | ICD-10-CM | POA: Diagnosis not present

## 2024-03-28 DIAGNOSIS — K828 Other specified diseases of gallbladder: Secondary | ICD-10-CM | POA: Diagnosis not present

## 2024-04-01 DIAGNOSIS — K859 Acute pancreatitis without necrosis or infection, unspecified: Secondary | ICD-10-CM | POA: Diagnosis not present

## 2024-04-01 DIAGNOSIS — E1165 Type 2 diabetes mellitus with hyperglycemia: Secondary | ICD-10-CM | POA: Diagnosis not present

## 2024-04-02 DIAGNOSIS — G243 Spasmodic torticollis: Secondary | ICD-10-CM | POA: Diagnosis not present

## 2024-04-02 DIAGNOSIS — K529 Noninfective gastroenteritis and colitis, unspecified: Secondary | ICD-10-CM | POA: Diagnosis not present

## 2024-04-04 DIAGNOSIS — G43911 Migraine, unspecified, intractable, with status migrainosus: Secondary | ICD-10-CM | POA: Diagnosis not present

## 2024-04-04 DIAGNOSIS — M7912 Myalgia of auxiliary muscles, head and neck: Secondary | ICD-10-CM | POA: Diagnosis not present

## 2024-04-04 DIAGNOSIS — R52 Pain, unspecified: Secondary | ICD-10-CM | POA: Diagnosis not present

## 2024-04-04 DIAGNOSIS — G243 Spasmodic torticollis: Secondary | ICD-10-CM | POA: Diagnosis not present

## 2024-04-16 DIAGNOSIS — R197 Diarrhea, unspecified: Secondary | ICD-10-CM | POA: Diagnosis not present

## 2024-04-16 DIAGNOSIS — R1110 Vomiting, unspecified: Secondary | ICD-10-CM | POA: Diagnosis not present

## 2024-04-22 DIAGNOSIS — G43719 Chronic migraine without aura, intractable, without status migrainosus: Secondary | ICD-10-CM | POA: Diagnosis not present

## 2024-04-22 DIAGNOSIS — M47812 Spondylosis without myelopathy or radiculopathy, cervical region: Secondary | ICD-10-CM | POA: Diagnosis not present

## 2024-04-22 DIAGNOSIS — M961 Postlaminectomy syndrome, not elsewhere classified: Secondary | ICD-10-CM | POA: Diagnosis not present

## 2024-04-22 DIAGNOSIS — G4489 Other headache syndrome: Secondary | ICD-10-CM | POA: Diagnosis not present

## 2024-04-25 ENCOUNTER — Ambulatory Visit: Payer: Self-pay | Admitting: General Surgery

## 2024-04-25 DIAGNOSIS — K828 Other specified diseases of gallbladder: Secondary | ICD-10-CM

## 2024-05-07 DIAGNOSIS — M47812 Spondylosis without myelopathy or radiculopathy, cervical region: Secondary | ICD-10-CM | POA: Diagnosis not present

## 2024-05-08 ENCOUNTER — Encounter (HOSPITAL_COMMUNITY): Payer: Self-pay

## 2024-05-08 NOTE — Progress Notes (Signed)
 Surgical Instructions   Your procedure is scheduled on Monday, September 29th. Report to [facility] A at 12:45 P.M., then check in with the Admitting office. Any questions or running late day of surgery: call [phone]  Questions prior to your surgery date: call [phone], Monday-Friday, 8am-4pm. If you experience any cold or flu symptoms such as cough, fever, chills, shortness of breath, etc. between now and your scheduled surgery, please notify us  at the above number.     Remember:  Do not eat after midnight the night before your surgery   You may drink clear liquids until 11:45 the morning of your surgery.   Clear liquids allowed are: Water, Non-Citrus Juices (without pulp), Carbonated Beverages, Clear Tea (no milk, honey, etc.), Black Coffee Only (NO MILK, CREAM OR POWDERED CREAMER of any kind), and Gatorade.    Take these medicines the morning of surgery with A SIP OF WATER  sertraline (ZOLOFT)    HOLD your empagliflozin (JARDIANCE) for 72 hours prior to surgery.  Last dose should be on Thursday, September 25th.    Do NOT take your metFORMIN (GLUCOPHAGE-XR) the morning of surgery.  Last dose should be on Sunday, September 28th.     One week prior to surgery, STOP taking any Aspirin (unless otherwise instructed by your surgeon) Aleve, Naproxen, Ibuprofen, Motrin, Advil, Goody's, BC's, all herbal medications, fish oil, and non-prescription vitamins.                     Do NOT Smoke (Tobacco/Vaping) for 24 hours prior to your procedure.  If you use a CPAP at night, you may bring your mask/headgear for your overnight stay.   You will be asked to remove any contacts, glasses, piercing's, hearing aid's, dentures/partials prior to surgery. Please bring cases for these items if needed.    Patients discharged the day of surgery will not be allowed to drive home, and someone needs to stay with them for 24 hours.  SURGICAL WAITING ROOM VISITATION Patients may  have no more than 2 support people in the waiting area - these visitors may rotate.   Pre-op nurse will coordinate an appropriate time for 1 ADULT support person, who may not rotate, to accompany patient in pre-op.  Children under the age of 76 must have an adult with them who is not the patient and must remain in the main waiting area with an adult.  If the patient needs to stay at the hospital during part of their recovery, the visitor guidelines for inpatient rooms apply.  Please refer to the [facility] website for the visitor guidelines for any additional information.   If you received a COVID test during your pre-op visit  it is requested that you wear a mask when out in public, stay away from anyone that may not be feeling well and notify your surgeon if you develop symptoms. If you have been in contact with anyone that has tested positive in the last 10 days please notify you surgeon.      Pre-operative CHG Bathing Instructions   You can play a key role in reducing the risk of infection after surgery. Your skin needs to be as free of germs as possible. You can reduce the number of germs on your skin by washing with CHG (chlorhexidine gluconate) soap before surgery. CHG is an antiseptic soap that kills germs and continues to kill germs even after washing.   DO NOT use if you have an allergy to chlorhexidine/CHG  or antibacterial soaps. If your skin becomes reddened or irritated, stop using the CHG and notify one of our RNs at [phone].              TAKE A SHOWER THE NIGHT BEFORE SURGERY AND THE DAY OF SURGERY    Please keep in mind the following:  DO NOT shave, including legs and underarms, 48 hours prior to surgery.   You may shave your face before/day of surgery.  Place clean sheets on your bed the night before surgery Use a clean washcloth (not used since being washed) for each shower. DO NOT sleep with pet's night before surgery.  CHG Shower Instructions:  Wash your face  and private area with normal soap. If you choose to wash your hair, wash first with your normal shampoo.  After you use shampoo/soap, rinse your hair and body thoroughly to remove shampoo/soap residue.  Turn the water OFF and apply half the bottle of CHG soap to a CLEAN washcloth.  Apply CHG soap ONLY FROM YOUR NECK DOWN TO YOUR TOES (washing for 3-5 minutes)  DO NOT use CHG soap on face, private areas, open wounds, or sores.  Pay special attention to the area where your surgery is being performed.  If you are having back surgery, having someone wash your back for you may be helpful. Wait 2 minutes after CHG soap is applied, then you may rinse off the CHG soap.  Pat dry with a clean towel  Put on clean pajamas    Additional instructions for the day of surgery: DO NOT APPLY any lotions, deodorants, cologne, or perfumes.   Do not wear jewelry or makeup Do not wear nail polish, gel polish, artificial nails, or any other type of covering on natural nails (fingers and toes) Do not bring valuables to the hospital. [facility] is not responsible for valuables/personal belongings. Put on clean/comfortable clothes.  Please brush your teeth.  Ask your nurse before applying any prescription medications to the skin.

## 2024-05-08 NOTE — Progress Notes (Signed)
 PCP - Dr Charletta [facility] Cardiologist - none  Chest x-ray - n/a EKG - 05/09/24 Stress Test - 07/30/14 ECHO - n/a Cardiac Cath - n/a  ICD Pacemaker/Loop - n/a  Sleep Study -  Yes, 07/30/13 CPAP - does not wear CPAP  Diabetes Type 2,  Fasting blood sugar twice weekly -130s-170s Do not take Metformin on DOS.  Aspirin & Blood Thinner Instructions:  n/a.  ERAS - clear liquids til 11:45 AM DOS  Anesthesia review: Yes  STOP now taking any Aspirin (unless otherwise instructed by your surgeon), Aleve, Naproxen, Ibuprofen, Motrin, Advil, Goody's, BC's, all herbal medications, fish oil, and all vitamins.   Coronavirus Screening Do you have any of the following symptoms:  Cough yes/no: No Fever (>100.30F)  yes/no: No Runny nose yes/no: No Sore throat yes/no: No Difficulty breathing/shortness of breath  yes/no: No  Have you traveled in the last 14 days and where? yes/no: No  Patient verbalized understanding of instructions that were given to them at the PAT appointment. Patient was also instructed that they will need to review over the PAT instructions again at home before surgery.

## 2024-05-09 ENCOUNTER — Other Ambulatory Visit: Payer: Self-pay

## 2024-05-09 ENCOUNTER — Encounter (HOSPITAL_COMMUNITY): Payer: Self-pay

## 2024-05-09 ENCOUNTER — Encounter (HOSPITAL_COMMUNITY)
Admission: RE | Admit: 2024-05-09 | Discharge: 2024-05-09 | Disposition: A | Discharge status: Home / Self Care | Source: Ambulatory Visit | Attending: Orthopaedic Surgery | Admitting: Orthopaedic Surgery

## 2024-05-09 VITALS — BP 123/85 | HR 67 | Temp 98.4°F | Resp 16 | Ht 65.5 in | Wt 180.3 lb

## 2024-05-09 DIAGNOSIS — Z01818 Encounter for other preprocedural examination: Secondary | ICD-10-CM | POA: Diagnosis not present

## 2024-05-09 HISTORY — DX: Hepatomegaly, not elsewhere classified: R16.0

## 2024-05-09 HISTORY — DX: Pneumonia, unspecified organism: J18.9

## 2024-05-09 HISTORY — DX: Depression, unspecified: F32.A

## 2024-05-09 HISTORY — DX: Attention-deficit hyperactivity disorder, unspecified type: F90.9

## 2024-05-09 HISTORY — DX: Type 2 diabetes mellitus without complications: E11.9

## 2024-05-09 HISTORY — DX: Cerebral infarction, unspecified: I63.9

## 2024-05-09 HISTORY — DX: Fatty (change of) liver, not elsewhere classified: K76.0

## 2024-05-09 HISTORY — DX: Sleep apnea, unspecified: G47.30

## 2024-05-09 HISTORY — DX: Unspecified cirrhosis of liver: K74.60

## 2024-05-09 HISTORY — DX: Unspecified osteoarthritis, unspecified site: M19.90

## 2024-05-09 LAB — COMPREHENSIVE METABOLIC PANEL WITH GFR
ALT: 76 U/L — ABNORMAL HIGH (ref 0–44)
AST: 61 U/L — ABNORMAL HIGH (ref 15–41)
Albumin: 3.7 g/dL (ref 3.5–5.0)
Alkaline Phosphatase: 80 U/L (ref 38–126)
Anion gap: 8 (ref 5–15)
BUN: 20 mg/dL (ref 6–20)
CO2: 26 mmol/L (ref 22–32)
Calcium: 9.3 mg/dL (ref 8.9–10.3)
Chloride: 102 mmol/L (ref 98–111)
Creatinine, Ser: 0.82 mg/dL (ref 0.44–1.00)
GFR, Estimated: >60 mL/min (ref >60)
Glucose, Bld: 169 mg/dL — ABNORMAL HIGH (ref 70–99)
Potassium: 4.7 mmol/L (ref 3.5–5.1)
Sodium: 136 mmol/L (ref 135–145)
Total Bilirubin: 0.7 mg/dL (ref 0.0–1.2)
Total Protein: 7.3 g/dL (ref 6.5–8.1)

## 2024-05-09 LAB — CBC
HCT: 42.1 % (ref 36.0–46.0)
Hemoglobin: 13.9 g/dL (ref 12.0–15.0)
MCH: 29.6 pg (ref 26.0–34.0)
MCHC: 33 g/dL (ref 30.0–36.0)
MCV: 89.8 fL (ref 80.0–100.0)
Platelets: 279 K/uL (ref 150–400)
RBC: 4.69 MIL/uL (ref 3.87–5.11)
RDW: 12.3 % (ref 11.5–15.5)
WBC: 6.6 K/uL (ref 4.0–10.5)
nRBC: 0 % (ref 0.0–0.2)

## 2024-05-09 LAB — GLUCOSE, CAPILLARY: Glucose-Capillary: 176 mg/dL — ABNORMAL HIGH (ref 70–99)

## 2024-05-10 ENCOUNTER — Encounter (HOSPITAL_COMMUNITY): Payer: Self-pay

## 2024-05-13 ENCOUNTER — Encounter (HOSPITAL_COMMUNITY)
Admission: RE | Disposition: A | Discharge status: Home / Self Care | Payer: Self-pay | Source: Home / Self Care | Attending: General Surgery

## 2024-05-13 ENCOUNTER — Other Ambulatory Visit: Payer: Self-pay

## 2024-05-13 ENCOUNTER — Encounter (HOSPITAL_COMMUNITY): Payer: Self-pay | Admitting: General Surgery

## 2024-05-13 ENCOUNTER — Ambulatory Visit (HOSPITAL_COMMUNITY): Payer: Self-pay | Admitting: Physician Assistant

## 2024-05-13 ENCOUNTER — Ambulatory Visit (HOSPITAL_COMMUNITY)

## 2024-05-13 ENCOUNTER — Ambulatory Visit (HOSPITAL_COMMUNITY)
Admission: RE | Admit: 2024-05-13 | Discharge: 2024-05-13 | Disposition: A | Discharge status: Home / Self Care | Attending: General Surgery | Admitting: General Surgery

## 2024-05-13 ENCOUNTER — Ambulatory Visit (HOSPITAL_COMMUNITY): Payer: Self-pay | Admitting: Anesthesiology

## 2024-05-13 DIAGNOSIS — E119 Type 2 diabetes mellitus without complications: Secondary | ICD-10-CM | POA: Insufficient documentation

## 2024-05-13 DIAGNOSIS — F419 Anxiety disorder, unspecified: Secondary | ICD-10-CM | POA: Insufficient documentation

## 2024-05-13 DIAGNOSIS — Z7984 Long term (current) use of oral hypoglycemic drugs: Secondary | ICD-10-CM | POA: Insufficient documentation

## 2024-05-13 DIAGNOSIS — F909 Attention-deficit hyperactivity disorder, unspecified type: Secondary | ICD-10-CM | POA: Diagnosis not present

## 2024-05-13 DIAGNOSIS — G43909 Migraine, unspecified, not intractable, without status migrainosus: Secondary | ICD-10-CM | POA: Insufficient documentation

## 2024-05-13 DIAGNOSIS — K828 Other specified diseases of gallbladder: Secondary | ICD-10-CM | POA: Insufficient documentation

## 2024-05-13 DIAGNOSIS — Z8673 Personal history of transient ischemic attack (TIA), and cerebral infarction without residual deficits: Secondary | ICD-10-CM | POA: Insufficient documentation

## 2024-05-13 HISTORY — PX: CHOLECYSTECTOMY: SHX55

## 2024-05-13 LAB — GLUCOSE, CAPILLARY
Glucose-Capillary: 174 mg/dL — ABNORMAL HIGH (ref 70–99)
Glucose-Capillary: 146 mg/dL — ABNORMAL HIGH (ref 70–99)

## 2024-05-13 SURGERY — LAPAROSCOPIC CHOLECYSTECTOMY WITH INTRAOPERATIVE CHOLANGIOGRAM
Anesthesia: General | Site: Abdomen

## 2024-05-13 MED ORDER — ROCURONIUM BROMIDE 10 MG/ML (PF) SYRINGE
PREFILLED_SYRINGE | INTRAVENOUS | Status: AC
  Filled 2024-05-13: qty 10

## 2024-05-13 MED ORDER — CHLORHEXIDINE GLUCONATE 0.12 % MT SOLN
OROMUCOSAL | Status: AC
  Administered 2024-05-13: 15 mL via OROMUCOSAL
  Filled 2024-05-13: qty 15

## 2024-05-13 MED ORDER — FENTANYL CITRATE (PF) 250 MCG/5ML IJ SOLN
INTRAMUSCULAR | Status: AC
  Filled 2024-05-13: qty 5

## 2024-05-13 MED ORDER — ACETAMINOPHEN 500 MG PO TABS: 1000.0000 mg | ORAL_TABLET | ORAL | Status: AC

## 2024-05-13 MED ORDER — OXYCODONE HCL 5 MG PO TABS
ORAL_TABLET | ORAL | Status: AC
  Filled 2024-05-13: qty 1

## 2024-05-13 MED ORDER — ORAL CARE MOUTH RINSE: 15.0000 mL | Freq: Once | OROMUCOSAL | Status: AC

## 2024-05-13 MED ORDER — DEXAMETHASONE SODIUM PHOSPHATE 10 MG/ML IJ SOLN
INTRAMUSCULAR | Status: AC
  Filled 2024-05-13: qty 1

## 2024-05-13 MED ORDER — 0.9 % SODIUM CHLORIDE (POUR BTL) OPTIME
TOPICAL | Status: DC | PRN
  Administered 2024-05-13: 1000 mL

## 2024-05-13 MED ORDER — GABAPENTIN 100 MG PO CAPS: 100.0000 mg | ORAL_CAPSULE | ORAL | Status: AC

## 2024-05-13 MED ORDER — CHLORHEXIDINE GLUCONATE CLOTH 2 % EX PADS
6.0000 | MEDICATED_PAD | Freq: Once | CUTANEOUS | Status: DC
Start: 2024-05-13 — End: 2024-05-13

## 2024-05-13 MED ORDER — AMISULPRIDE (ANTIEMETIC) 5 MG/2ML IV SOLN: 10.0000 mg | Freq: Once | INTRAVENOUS | Status: DC | PRN

## 2024-05-13 MED ORDER — ONDANSETRON HCL 4 MG/2ML IJ SOLN
INTRAMUSCULAR | Status: AC
Start: 2024-05-13 — End: 2024-05-13
  Filled 2024-05-13: qty 2

## 2024-05-13 MED ORDER — PROPOFOL 10 MG/ML IV BOLUS
INTRAVENOUS | Status: DC | PRN
  Administered 2024-05-13: 120 mg via INTRAVENOUS

## 2024-05-13 MED ORDER — PHENYLEPHRINE HCL-NACL 20-0.9 MG/250ML-% IV SOLN
INTRAVENOUS | Status: DC | PRN
  Administered 2024-05-13: 20 ug/min via INTRAVENOUS

## 2024-05-13 MED ORDER — SUGAMMADEX SODIUM 200 MG/2ML IV SOLN
INTRAVENOUS | Status: DC | PRN
  Administered 2024-05-13: 200 mg via INTRAVENOUS

## 2024-05-13 MED ORDER — SCOPOLAMINE 1 MG/3DAYS TD PT72
1.0000 | MEDICATED_PATCH | TRANSDERMAL | Status: DC
Start: 2024-05-13 — End: 2024-05-13
  Administered 2024-05-13: 1 mg via TRANSDERMAL

## 2024-05-13 MED ORDER — CHLORHEXIDINE GLUCONATE 0.12 % MT SOLN: 15.0000 mL | Freq: Once | OROMUCOSAL | Status: AC

## 2024-05-13 MED ORDER — PROPOFOL 500 MG/50ML IV EMUL
INTRAVENOUS | Status: DC | PRN
  Administered 2024-05-13: 150 ug/kg/min via INTRAVENOUS

## 2024-05-13 MED ORDER — BUPIVACAINE-EPINEPHRINE 0.25% -1:200000 IJ SOLN
INTRAMUSCULAR | Status: DC | PRN
  Administered 2024-05-13: 20 mL

## 2024-05-13 MED ORDER — INSULIN ASPART 100 UNIT/ML IJ SOLN: 0.0000 [IU] | INTRAMUSCULAR | Status: DC | PRN

## 2024-05-13 MED ORDER — SCOPOLAMINE 1 MG/3DAYS TD PT72
MEDICATED_PATCH | TRANSDERMAL | Status: AC
  Filled 2024-05-13: qty 1

## 2024-05-13 MED ORDER — ONDANSETRON HCL 4 MG/2ML IJ SOLN
4.0000 mg | Freq: Once | INTRAMUSCULAR | Status: AC | PRN
  Administered 2024-05-13: 4 mg via INTRAVENOUS

## 2024-05-13 MED ORDER — LIDOCAINE 2% (20 MG/ML) 5 ML SYRINGE
INTRAMUSCULAR | Status: DC | PRN
  Administered 2024-05-13: 100 mg via INTRAVENOUS

## 2024-05-13 MED ORDER — SODIUM CHLORIDE 0.9 % IV SOLN
INTRAVENOUS | Status: DC | PRN
  Administered 2024-05-13: 28 mL

## 2024-05-13 MED ORDER — DEXAMETHASONE SODIUM PHOSPHATE 10 MG/ML IJ SOLN
INTRAMUSCULAR | Status: DC | PRN
  Administered 2024-05-13: 5 mg via INTRAVENOUS

## 2024-05-13 MED ORDER — LIDOCAINE 2% (20 MG/ML) 5 ML SYRINGE
INTRAMUSCULAR | Status: AC
Start: 2024-05-13 — End: 2024-05-13
  Filled 2024-05-13: qty 5

## 2024-05-13 MED ORDER — CEFAZOLIN SODIUM-DEXTROSE 2-4 GM/100ML-% IV SOLN
2.0000 g | INTRAVENOUS | Status: AC
  Administered 2024-05-13: 2 g via INTRAVENOUS

## 2024-05-13 MED ORDER — LACTATED RINGERS IV SOLN: INTRAVENOUS | Status: DC

## 2024-05-13 MED ORDER — GABAPENTIN 100 MG PO CAPS
ORAL_CAPSULE | ORAL | Status: AC
  Administered 2024-05-13: 100 mg via ORAL
  Filled 2024-05-13: qty 1

## 2024-05-13 MED ORDER — LIDOCAINE 2% (20 MG/ML) 5 ML SYRINGE
INTRAMUSCULAR | Status: AC
  Filled 2024-05-13: qty 5

## 2024-05-13 MED ORDER — CHLORHEXIDINE GLUCONATE CLOTH 2 % EX PADS: 6.0000 | MEDICATED_PAD | Freq: Once | CUTANEOUS | Status: DC

## 2024-05-13 MED ORDER — SODIUM CHLORIDE 0.9 % IR SOLN
Status: DC | PRN
  Administered 2024-05-13: 1000 mL

## 2024-05-13 MED ORDER — ACETAMINOPHEN 500 MG PO TABS
ORAL_TABLET | ORAL | Status: AC
  Administered 2024-05-13: 1000 mg via ORAL
  Filled 2024-05-13: qty 2

## 2024-05-13 MED ORDER — FENTANYL CITRATE (PF) 250 MCG/5ML IJ SOLN
INTRAMUSCULAR | Status: DC | PRN
  Administered 2024-05-13 (×3): 50 ug via INTRAVENOUS

## 2024-05-13 MED ORDER — EPHEDRINE 5 MG/ML INJ
INTRAVENOUS | Status: AC
Start: 2024-05-13 — End: 2024-05-13
  Filled 2024-05-13: qty 5

## 2024-05-13 MED ORDER — FENTANYL CITRATE (PF) 100 MCG/2ML IJ SOLN
INTRAMUSCULAR | Status: AC
  Filled 2024-05-13: qty 2

## 2024-05-13 MED ORDER — MIDAZOLAM HCL 2 MG/2ML IJ SOLN
INTRAMUSCULAR | Status: AC
  Filled 2024-05-13: qty 2

## 2024-05-13 MED ORDER — ONDANSETRON HCL 4 MG/2ML IJ SOLN
INTRAMUSCULAR | Status: AC
  Filled 2024-05-13: qty 2

## 2024-05-13 MED ORDER — PROPOFOL 10 MG/ML IV BOLUS
INTRAVENOUS | Status: AC
  Filled 2024-05-13: qty 20

## 2024-05-13 MED ORDER — ROCURONIUM BROMIDE 10 MG/ML (PF) SYRINGE
PREFILLED_SYRINGE | INTRAVENOUS | Status: DC | PRN
  Administered 2024-05-13: 20 mg via INTRAVENOUS
  Administered 2024-05-13: 50 mg via INTRAVENOUS

## 2024-05-13 MED ORDER — MIDAZOLAM HCL 2 MG/2ML IJ SOLN
INTRAMUSCULAR | Status: DC | PRN
  Administered 2024-05-13: 2 mg via INTRAVENOUS

## 2024-05-13 MED ORDER — OXYCODONE HCL 5 MG PO TABS: 5.0000 mg | ORAL_TABLET | Freq: Four times a day (QID) | ORAL | 0 refills | Status: AC | PRN

## 2024-05-13 MED ORDER — GLYCOPYRROLATE PF 0.2 MG/ML IJ SOSY
PREFILLED_SYRINGE | INTRAMUSCULAR | Status: DC | PRN
  Administered 2024-05-13: .1 mg via INTRAVENOUS

## 2024-05-13 MED ORDER — CEFAZOLIN SODIUM-DEXTROSE 2-4 GM/100ML-% IV SOLN
INTRAVENOUS | Status: AC
  Filled 2024-05-13: qty 100

## 2024-05-13 MED ORDER — OXYCODONE HCL 5 MG PO TABS
5.0000 mg | ORAL_TABLET | Freq: Once | ORAL | Status: AC
  Administered 2024-05-13: 5 mg via ORAL

## 2024-05-13 MED ORDER — FENTANYL CITRATE (PF) 100 MCG/2ML IJ SOLN: 25.0000 ug | INTRAMUSCULAR | Status: DC | PRN

## 2024-05-13 MED ORDER — ONDANSETRON HCL 4 MG/2ML IJ SOLN
INTRAMUSCULAR | Status: DC | PRN
Start: 2024-05-13 — End: 2024-05-13
  Administered 2024-05-13: 4 mg via INTRAVENOUS

## 2024-05-13 MED ORDER — BUPIVACAINE-EPINEPHRINE (PF) 0.25% -1:200000 IJ SOLN
INTRAMUSCULAR | Status: AC
Start: 2024-05-13 — End: 2024-05-13
  Filled 2024-05-13: qty 30

## 2024-05-13 SURGICAL SUPPLY — 33 items
BAG COUNTER SPONGE SURGICOUNT (BAG) ×1 IMPLANT
CANISTER SUCTION 3000ML PPV (SUCTIONS) ×1 IMPLANT
CATH REDDICK CHOLANGI 4FR 50CM (CATHETERS) ×1 IMPLANT
CHLORAPREP W/TINT 26 (MISCELLANEOUS) ×1 IMPLANT
CLIP APPLIE 5 13 M/L LIGAMAX5 (MISCELLANEOUS) ×1 IMPLANT
COVER MAYO STAND STRL (DRAPES) ×1 IMPLANT
COVER SURGICAL LIGHT HANDLE (MISCELLANEOUS) ×1 IMPLANT
DERMABOND ADVANCED .7 DNX12 (GAUZE/BANDAGES/DRESSINGS) ×1 IMPLANT
DRAPE C-ARM 42X120 X-RAY (DRAPES) ×1 IMPLANT
ELECTRODE REM PT RTRN 9FT ADLT (ELECTROSURGICAL) ×1 IMPLANT
GLOVE BIO SURGEON STRL SZ7.5 (GLOVE) ×1 IMPLANT
GOWN STRL REUS W/ TWL LRG LVL3 (GOWN DISPOSABLE) ×3 IMPLANT
IRRIGATION SUCT STRKRFLW 2 WTP (MISCELLANEOUS) ×1 IMPLANT
IV CATH 14GX2 1/4 (CATHETERS) ×1 IMPLANT
KIT BASIN OR (CUSTOM PROCEDURE TRAY) ×1 IMPLANT
KIT TURNOVER KIT B (KITS) ×1 IMPLANT
PAD ARMBOARD POSITIONER FOAM (MISCELLANEOUS) ×1 IMPLANT
SCISSORS LAP 5X35 DISP (ENDOMECHANICALS) ×1 IMPLANT
SET TUBE SMOKE EVAC HIGH FLOW (TUBING) ×1 IMPLANT
SLEEVE Z-THREAD 5X100MM (TROCAR) ×2 IMPLANT
SOLN 0.9% NACL 1000 ML (IV SOLUTION) ×1 IMPLANT
SOLN 0.9% NACL POUR BTL 1000ML (IV SOLUTION) ×1 IMPLANT
SOLN STERILE WATER 1000 ML (IV SOLUTION) ×1 IMPLANT
SOLN STERILE WATER BTL 1000 ML (IV SOLUTION) ×1 IMPLANT
SPECIMEN JAR SMALL (MISCELLANEOUS) ×1 IMPLANT
SUT MNCRL AB 4-0 PS2 18 (SUTURE) ×1 IMPLANT
SYSTEM BAG RETRIEVAL 10MM (BASKET) ×1 IMPLANT
TOWEL GREEN STERILE (TOWEL DISPOSABLE) ×1 IMPLANT
TOWEL GREEN STERILE FF (TOWEL DISPOSABLE) ×1 IMPLANT
TRAY LAPAROSCOPIC MC (CUSTOM PROCEDURE TRAY) ×1 IMPLANT
TROCAR BALLN 12MMX100 BLUNT (TROCAR) ×1 IMPLANT
TROCAR Z-THREAD OPTICAL 5X100M (TROCAR) ×1 IMPLANT
WARMER LAPAROSCOPE (MISCELLANEOUS) ×1 IMPLANT

## 2024-05-13 NOTE — Anesthesia Postprocedure Evaluation (Signed)
 Anesthesia Post Note  Patient: Mariah Gregory  Procedure(s) Performed: LAPAROSCOPIC CHOLECYSTECTOMY WITH INTRAOPERATIVE CHOLANGIOGRAM (Abdomen)     Patient location during evaluation: PACU Anesthesia Type: General Level of consciousness: awake and alert Pain management: pain level controlled Vital Signs Assessment: post-procedure vital signs reviewed and stable Respiratory status: spontaneous breathing, nonlabored ventilation, respiratory function stable and patient connected to nasal cannula oxygen Cardiovascular status: blood pressure returned to baseline and stable Postop Assessment: no apparent nausea or vomiting Anesthetic complications: no   No notable events documented.  Last Vitals:  Vitals:   05/13/24 1615 05/13/24 1630  BP: (!) 144/68   Pulse: 72 69  Resp: (!) 23 16  Temp:    SpO2: 97% 93%    Last Pain:  Vitals:   05/13/24 1605  TempSrc:   PainSc: 0-No pain                 Garnette FORBES Skillern

## 2024-05-13 NOTE — Interval H&P Note (Signed)
 History and Physical Interval Note:  05/13/2024 2:12 PM  Mariah Gregory  has presented today for surgery, with the diagnosis of BILIARY DYSKINESIA.  The various methods of treatment have been discussed with the patient and family. After consideration of risks, benefits and other options for treatment, the patient has consented to  Procedure(s): LAPAROSCOPIC CHOLECYSTECTOMY WITH INTRAOPERATIVE CHOLANGIOGRAM (N/A) as a surgical intervention.  The patient's history has been reviewed, patient examined, no change in status, stable for surgery.  I have reviewed the patient's chart and labs.  Questions were answered to the patient's satisfaction.     Deward Null III

## 2024-05-13 NOTE — Op Note (Signed)
 05/13/2024  3:49 PM  PATIENT:  Mariah Gregory  53 y.o. female  PRE-OPERATIVE DIAGNOSIS:  BILIARY DYSKINESIA  POST-OPERATIVE DIAGNOSIS:  BILIARY DYSKINESIA  PROCEDURE:  Procedure(s): LAPAROSCOPIC CHOLECYSTECTOMY WITH INTRAOPERATIVE CHOLANGIOGRAM (N/A)  SURGEON:  Surgeons and Role:    * Curvin Deward MOULD, MD - Primary  PHYSICIAN ASSISTANT:   ASSISTANTS: Waddell Collier, RNFA   ANESTHESIA:   local and general  EBL:  minimal   BLOOD ADMINISTERED:none  DRAINS: none   LOCAL MEDICATIONS USED:  MARCAINE     SPECIMEN:  Source of Specimen:  gallbladder  DISPOSITION OF SPECIMEN:  PATHOLOGY  COUNTS:  YES  TOURNIQUET:  * No tourniquets in log *  DICTATION: .Dragon Dictation    Procedure: After informed consent was obtained the patient was brought to the operating room and placed in the supine position on the operating room table. After adequate induction of general anesthesia the patient's abdomen was prepped with ChloraPrep allowed to dry and draped in usual sterile manner. An appropriate timeout was performed. The area above the umbilicus was infiltrated with quarter percent  Marcaine. A small incision was made with a 15 blade knife. The incision was carried down through the subcutaneous tissue bluntly with a hemostat and [employer] retractors. The linea alba was identified. The linea alba was incised with a 15 blade knife and each side was grasped with Coker clamps. The preperitoneal space was then probed with a hemostat until the peritoneum was opened and access was gained to the abdominal cavity. A 0 Vicryl pursestring stitch was placed in the fascia surrounding the opening. A Hassan cannula was then placed through the opening and anchored in place with the previously placed Vicryl purse string stitch. The abdomen was insufflated with carbon dioxide without difficulty. A laparoscope was inserted through the [facility] cannula in the right upper quadrant was inspected. Next the  epigastric region was infiltrated with % Marcaine. A small incision was made with a 15 blade knife. A 5 mm port was placed bluntly through this incision into the abdominal cavity under direct vision. Next 2 sites were chosen laterally on the right side of the abdomen for placement of 5 mm ports. Each of these areas was infiltrated with quarter percent Marcaine. Small stab incisions were made with a 15 blade knife. 5 mm ports were then placed bluntly through these incisions into the abdominal cavity under direct vision without difficulty. A blunt grasper was placed through the lateralmost 5 mm port and used to grasp the dome of the gallbladder and elevate it anteriorly and superiorly. Another blunt grasper was placed through the other 5 mm port and used to retract the body and neck of the gallbladder. A dissector was placed through the epigastric port and using the electrocautery the peritoneal reflection at the gallbladder neck was opened. Blunt dissection was then carried out in this area until the gallbladder neck-cystic duct junction was readily identified and a good window was created. A single clip was placed on the gallbladder neck. A small  ductotomy was made just below the clip with laparoscopic scissors. A 14-gauge Angiocath was then placed through the anterior abdominal wall under direct vision. A Reddick cholangiogram catheter was then placed through the Angiocath and flushed. The catheter was then placed in the cystic duct and anchored in place with a clip. A cholangiogram was obtained that showed no filling defects, good emptying into the duodenum, and an adequate length on the cystic duct. The anchoring clip and catheters were then removed from  the patient. 3 clips were placed proximally on the cystic duct and the duct was divided between the 2 sets of clips. Posterior to this the cystic artery was identified and again dissected bluntly in a circumferential manner until a good window  was created. 2  clips were placed proximally and one distally on the artery and the artery was divided between the 2 sets of clips. Next a laparoscopic hook cautery device was used to separate the gallbladder from the liver bed. Prior to completely detaching the gallbladder from the liver bed the liver bed was inspected and several small bleeding points were coagulated with the electrocautery until the area was completely hemostatic. The gallbladder was then detached the rest of it from the liver bed without difficulty. A laparoscopic bag was inserted through the hassan port. The laparoscope was moved to the epigastric port. The gallbladder was placed within the bag and the bag was sealed.  The bag with the gallbladder was then removed with the [facility] cannula through the infraumbilical port without difficulty. The fascial defect was then closed with the previously placed Vicryl pursestring stitch as well as with another figure-of-eight 0 Vicryl stitch. The liver bed was inspected again and found to be hemostatic. The abdomen was irrigated with copious amounts of saline until the effluent was clear. The ports were then removed under direct vision without difficulty and were found to be hemostatic. The gas was allowed to escape. No other abnormalities were noted on general inspection of the abdomen. The skin incisions were all closed with interrupted 4-0 Monocryl subcuticular stitches. Dermabond dressings were applied. The patient tolerated the procedure well. At the end of the case all needle sponge and instrument counts were correct. The patient was then awakened and taken to recovery in stable condition  PLAN OF CARE: Discharge to home after PACU  PATIENT DISPOSITION:  PACU - hemodynamically stable.   Delay start of Pharmacological VTE agent (>24hrs) due to surgical blood loss or risk of bleeding: not applicable

## 2024-05-13 NOTE — Anesthesia Preprocedure Evaluation (Addendum)
 Anesthesia Evaluation  Patient identified by MRN, date of birth, ID band Patient awake    Reviewed: Allergy & Precautions, NPO status , Patient's Chart, lab work & pertinent test results  Airway Mallampati: II  TM Distance: >3 FB Neck ROM: Full    Dental  (+) Dental Advisory Given, Missing   Pulmonary sleep apnea    Pulmonary exam normal breath sounds clear to auscultation       Cardiovascular negative cardio ROS Normal cardiovascular exam Rhythm:Regular Rate:Normal     Neuro/Psych  Headaches, Seizures -, Well Controlled,  PSYCHIATRIC DISORDERS Anxiety Depression    TIACVA, No Residual Symptoms    GI/Hepatic negative GI ROS,,,(+) Cirrhosis       BILIARY DYSKINESIA   Endo/Other  diabetes, Type 2, Oral Hypoglycemic Agents    Renal/GU negative Renal ROS     Musculoskeletal  (+) Arthritis ,    Abdominal   Peds  (+) ADHD Hematology negative hematology ROS (+)   Anesthesia Other Findings Day of surgery medications reviewed with the patient.  Reproductive/Obstetrics                              Anesthesia Physical Anesthesia Plan  ASA: 3  Anesthesia Plan: General   Post-op Pain Management: Toradol  IV (intra-op)*   Induction: Intravenous  PONV Risk Score and Plan: 4 or greater and Midazolam, Dexamethasone , Ondansetron  and Scopolamine patch - Pre-op  Airway Management Planned: Oral ETT  Additional Equipment:   Intra-op Plan:   Post-operative Plan: Extubation in OR  Informed Consent: I have reviewed the patients History and Physical, chart, labs and discussed the procedure including the risks, benefits and alternatives for the proposed anesthesia with the patient or authorized representative who has indicated his/her understanding and acceptance.     Dental advisory given  Plan Discussed with: CRNA  Anesthesia Plan Comments:          Anesthesia Quick Evaluation

## 2024-05-13 NOTE — H&P (Signed)
 REFERRING PHYSICIAN: Kristie Lamprey, MD PROVIDER: DEWARD GARNETTE NULL, MD MRN: [id number] DOB: [birth year] Subjective   Chief Complaint: Follow-up  History of Present Illness: Mariah Gregory is a 53 y.o. female who is seen today as an office consultation for evaluation of Follow-up  We are asked to see the patient in consultation by Dr. Josie Mann to evaluate her for biliary dyskinesia. The patient is a 53 year old white female who has been sick for the last 2 months. She has been experiencing intermittent nausea and vomiting. She has also been having constant abdominal pain and diarrhea. She has pain throughout her abdomen but worse on the right. She has many episodes of diarrhea a day. This all started at the same time that her mother got sick with similar symptoms. Her mother was diagnosed with C. difficile colitis and was treated and got better. The patient has never had more than 5 days of antibiotics for any of her symptoms. During that time she was also evaluated with ultrasound which was negative. She had a HIDA scan that showed she had an increased ejection fraction at 96%. Since her last visit her symptoms have not improved at all. She is ready to try removing the gallbladder  Review of Systems: A complete review of systems was obtained from the patient. I have reviewed this information and discussed as appropriate with the patient. See HPI as well for other ROS.  ROS   Medical History: Past Medical History:  Diagnosis Date  Anxiety  Elevated liver enzymes  non alcoholic cirrhosis?  Fall from balcony, sequela  fall 13 1/2 feet from balcony with subsequent head injury, right hand fracture  Fatty liver  Migraines  result of head trauma in 1988 takes tizanadine and botox injections  Seizures (CMS/HHS-HCC)  began at age 28, took siezure meds for years  Sleep apnea  does not use CPAP  Stroke (CMS/HHS-HCC)  age 80, left sided weakness, now resolved   Patient Active Problem List   Diagnosis  Fusion of spine of cervical region  Neck pain  Diarrhea  Biliary dyskinesia   Past Surgical History:  Procedure Laterality Date  HYSTERECTOMY 2001  oopherectomy Right 2014  ARTHRODESIS ANTERIOR CERVICLE SPINE N/A 07/13/2015  Procedure: ACDF C3-4; Surgeon: Lonni Lamar Daring, MD; Location: [facility] OR; Service: Orthopedics; Laterality: N/A;  INSTRUMENTATION ANTERIOR SPINE 2 TO 3 VERTEBRAL SEGMENTS N/A 07/13/2015  Procedure: ANTERIOR INSTRUMENTATION; 2 TO 3 VERTEBRAL SEGMENTS (LIST IN ADDITION TO PRIMARY PROCEDURE); Surgeon: Lonni Lamar Daring, MD; Location: [facility] OR; Service: Orthopedics; Laterality: N/A;  INSERTION MORSELIZED BONE ALLOGRAFT FOR SPINE SURGERY N/A 07/13/2015  Procedure: ALLOGRAFT, MORSELIZED, OR PLACEMENT OF OSTEOPROMOTIVE MATERIAL, FOR SPINE SURGERY ONLY (LIST IN ADDITION TO PRIMARY PROCEDURE); Surgeon: Lonni Lamar Daring, MD; Location: [facility] OR; Service: Orthopedics; Laterality: N/A;  APPLICATION VERTERBRAL DEFECT PROSTHETIC DEVICE N/A 07/13/2015  Procedure: APPLICATION OF INTERVERTEBRAL BIOMECHANICAL DEVICE(S) (EG, SYNTHETIC CAGE(S), METHYLMETHACRYLATE) TO VERTEBRAL DEFECT OR INTERSPACE (LIST IN ADDITION TO PRIMARY PROCEDURE); Surgeon: Lonni Lamar Daring, MD; Location: [facility] OR; Service: Orthopedics; Laterality: N/A;  laproroscopy  multiple abdominal for endometriosis and cysts 1995-2007    Allergies  Allergen Reactions  Verapamil Rash  Eptinezumab -Jjmr Rash  Vyepti    Current Outpatient Medications on File Prior to Visit  Medication Sig Dispense Refill  atogepant (QULIPTA) 60 mg Tab Take 60 mg by mouth once daily  dextroamphetamine-amphetamine (ADDERALL XR) 15 MG XR capsule Take 15 mg by mouth every morning Patient takes every other day  erenumab-aooe (AIMOVIG AUTOINJECTOR) 140 mg/mL AtIn Inject 140 mg subcutaneously every  28 (twenty-eight) days  escitalopram oxalate (LEXAPRO) 20 MG tablet Take 20 mg by mouth every morning.    loratadine (CLARITIN) 10 mg tablet Take 10 mg by mouth once daily  onabotulinumtoxinA (BOTOX) 200 unit SolR Inject as directed. For migraines Every 10 weeks  pregabalin (LYRICA) 25 MG capsule TAKE 3 CAPSULES BY MOUTH AT BEDTIME 90 capsule 0  tiZANidine (ZANAFLEX) 4 MG capsule Take 8 mg by mouth every other night  VITAMIN D3 ORAL Take by mouth 4000iu daily   No current facility-administered medications on file prior to visit.   Family History  Problem Relation Age of Onset  Migraines Mother  Diabetes Mother  Cancer Mother  ovarian cancer  Heart disease Mother  Migraines Son    Social History   Tobacco Use  Smoking Status Never  Smokeless Tobacco Never    Social History   Socioeconomic History  Marital status: Married  Occupational History  Comment: Cleaning houses/offices  Tobacco Use  Smoking status: Never  Smokeless tobacco: Never  Vaping Use  Vaping status: Never Used  Substance and Sexual Activity  Alcohol use: Yes  Comment: once in a blue moon I'll have a toasted almond  Drug use: No  Sexual activity: Defer   Social Drivers of Health   Housing Stability: Unknown (09/19/2023)  Housing Stability Vital Sign  Homeless in the Last Year: No   Objective:   There were no vitals filed for this visit.  There is no height or weight on file to calculate BMI.  Physical Exam Vitals reviewed.  Constitutional:  General: She is not in acute distress. Appearance: Normal appearance.  HENT:  Head: Normocephalic and atraumatic.  Right Ear: External ear normal.  Left Ear: External ear normal.  Nose: Nose normal.  Mouth/Throat:  Mouth: Mucous membranes are moist.  Pharynx: Oropharynx is clear.  Eyes:  General: No scleral icterus. Extraocular Movements: Extraocular movements intact.  Conjunctiva/sclera: Conjunctivae normal.  Pupils: Pupils are equal, round, and reactive to light.  Cardiovascular:  Rate and Rhythm: Normal rate and regular rhythm.  Pulses:  Normal pulses.  Heart sounds: Normal heart sounds.  Pulmonary:  Effort: Pulmonary effort is normal. No respiratory distress.  Breath sounds: Normal breath sounds.  Abdominal:  General: Bowel sounds are normal.  Palpations: Abdomen is soft.  Tenderness: There is no abdominal tenderness.  Comments: There is mild to moderate diffuse abdominal pain worse on the right side. No peritonitis.  Musculoskeletal:  General: No swelling, tenderness or deformity. Normal range of motion.  Cervical back: Normal range of motion and neck supple.  Skin: General: Skin is warm and dry.  Coloration: Skin is not jaundiced.  Neurological:  General: No focal deficit present.  Mental Status: She is alert and oriented to person, place, and time.  Psychiatric:  Mood and Affect: Mood normal.  Behavior: Behavior normal.     Labs, Imaging and Diagnostic Testing:  Assessment and Plan:   Diagnoses and all orders for this visit:  Biliary dyskinesia   The patient does have an increased ejection fraction of the gallbladder which certainly could cause some abdominal discomfort but I doubt that it would cause such significant pain on both sides as well as diarrhea. She has been exposed at the same time her symptoms started to C. difficile. She has not been treated for this as best I can tell. I certainly can take her gallbladder out but I am not confident that doing so will improve her symptoms at all. I do think it would be  worth an adequate treatment for C. difficile colitis to see if her symptoms get better before we think about surgery. We will start her on Flagyl and see her back in the next 3 weeks. She should continue to follow-up with her gastroenterologist. Since her last visit her symptoms have not improved at all. She is now radiated to try removing the gallbladder. I have discussed with her in detail the risks and benefits of the operation as well as some of the technical aspects including common bile duct  injury and she understands and wishes to proceed

## 2024-05-13 NOTE — Transfer of Care (Signed)
 Immediate Anesthesia Transfer of Care Note  Patient: Mariah Gregory  Procedure(s) Performed: LAPAROSCOPIC CHOLECYSTECTOMY WITH INTRAOPERATIVE CHOLANGIOGRAM (Abdomen)  Patient Location: PACU  Anesthesia Type:General  Level of Consciousness: awake, alert , and oriented  Airway & Oxygen Therapy: Patient Spontanous Breathing and Patient connected to nasal cannula oxygen  Post-op Assessment: Report given to RN and Post -op Vital signs reviewed and stable  Post vital signs: Reviewed and stable  Last Vitals:  Vitals Value Taken Time  BP 159/71 05/13/24 16:04  Temp    Pulse 72 05/13/24 16:12  Resp 25 05/13/24 16:12  SpO2 95 % 05/13/24 16:12  Vitals shown include unfiled device data.  Last Pain:  Vitals:   05/13/24 1258  TempSrc:   PainSc: 7       Patients Stated Pain Goal: 5 (05/13/24 1258)  Complications: No notable events documented.

## 2024-05-13 NOTE — Anesthesia Procedure Notes (Signed)
 Procedure Name: Intubation Date/Time: 05/13/2024 2:55 PM  Performed by: Vertie Russell NOVAK, CRNAPre-anesthesia Checklist: Patient identified, Emergency Drugs available, Suction available and Patient being monitored Patient Re-evaluated:Patient Re-evaluated prior to induction Oxygen Delivery Method: Circle System Utilized Preoxygenation: Pre-oxygenation with 100% oxygen Induction Type: IV induction Ventilation: Mask ventilation without difficulty Laryngoscope Size: Mac and 4 Grade View: Grade II Tube type: Oral Tube size: 7.0 mm Number of attempts: 1 Airway Equipment and Method: Stylet and Oral airway Placement Confirmation: ETT inserted through vocal cords under direct vision, positive ETCO2 and breath sounds checked- equal and bilateral Secured at: 22 cm Tube secured with: Tape Dental Injury: Teeth and Oropharynx as per pre-operative assessment

## 2024-05-14 ENCOUNTER — Encounter (HOSPITAL_COMMUNITY): Payer: Self-pay | Admitting: General Surgery

## 2024-05-15 LAB — SURGICAL PATHOLOGY

## 2024-05-25 DIAGNOSIS — J029 Acute pharyngitis, unspecified: Secondary | ICD-10-CM | POA: Diagnosis not present

## 2024-05-25 DIAGNOSIS — Z20822 Contact with and (suspected) exposure to covid-19: Secondary | ICD-10-CM | POA: Diagnosis not present

## 2024-05-25 DIAGNOSIS — U071 COVID-19: Secondary | ICD-10-CM | POA: Diagnosis not present

## 2024-05-25 DIAGNOSIS — L231 Allergic contact dermatitis due to adhesives: Secondary | ICD-10-CM | POA: Diagnosis not present

## 2024-05-25 DIAGNOSIS — R590 Localized enlarged lymph nodes: Secondary | ICD-10-CM | POA: Diagnosis not present

## 2024-05-25 DIAGNOSIS — J0100 Acute maxillary sinusitis, unspecified: Secondary | ICD-10-CM | POA: Diagnosis not present

## 2024-06-12 DIAGNOSIS — G43909 Migraine, unspecified, not intractable, without status migrainosus: Secondary | ICD-10-CM | POA: Diagnosis not present

## 2024-06-12 DIAGNOSIS — R110 Nausea: Secondary | ICD-10-CM | POA: Diagnosis not present

## 2024-06-20 DIAGNOSIS — G243 Spasmodic torticollis: Secondary | ICD-10-CM | POA: Diagnosis not present

## 2024-06-27 DIAGNOSIS — G243 Spasmodic torticollis: Secondary | ICD-10-CM | POA: Diagnosis not present

## 2024-06-27 DIAGNOSIS — R52 Pain, unspecified: Secondary | ICD-10-CM | POA: Diagnosis not present

## 2024-06-27 DIAGNOSIS — G43911 Migraine, unspecified, intractable, with status migrainosus: Secondary | ICD-10-CM | POA: Diagnosis not present

## 2024-07-02 DIAGNOSIS — G4486 Cervicogenic headache: Secondary | ICD-10-CM | POA: Diagnosis not present

## 2024-07-02 DIAGNOSIS — M47812 Spondylosis without myelopathy or radiculopathy, cervical region: Secondary | ICD-10-CM | POA: Diagnosis not present

## 2024-07-16 DIAGNOSIS — M47812 Spondylosis without myelopathy or radiculopathy, cervical region: Secondary | ICD-10-CM | POA: Diagnosis not present

## 2024-07-29 DIAGNOSIS — Z09 Encounter for follow-up examination after completed treatment for conditions other than malignant neoplasm: Secondary | ICD-10-CM | POA: Diagnosis not present

## 2024-07-29 DIAGNOSIS — F32A Depression, unspecified: Secondary | ICD-10-CM | POA: Diagnosis not present

## 2024-07-29 DIAGNOSIS — Z860100 Personal history of colon polyps, unspecified: Secondary | ICD-10-CM | POA: Diagnosis not present
